# Patient Record
Sex: Male | Born: 1950 | Hispanic: No | Marital: Married | Smoking: Former smoker
Health system: Southern US, Community
[De-identification: ages and names within clinical notes are randomized; demographics above are authoritative.]

## PROBLEM LIST (undated history)

## (undated) DIAGNOSIS — W3400XA Accidental discharge from unspecified firearms or gun, initial encounter: Secondary | ICD-10-CM

## (undated) DIAGNOSIS — I728 Aneurysm of other specified arteries: Secondary | ICD-10-CM

## (undated) HISTORY — PX: CHOLECYSTECTOMY: SHX55

## (undated) HISTORY — DX: Aneurysm of other specified arteries: I72.8

---
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
## 2002-05-16 ENCOUNTER — Encounter: Payer: Self-pay | Admitting: Emergency Medicine

## 2002-05-16 ENCOUNTER — Emergency Department (HOSPITAL_COMMUNITY): Admission: EM | Admit: 2002-05-16 | Discharge: 2002-05-16 | Payer: Self-pay | Admitting: Emergency Medicine

## 2011-06-14 ENCOUNTER — Emergency Department (HOSPITAL_COMMUNITY)
Admission: EM | Admit: 2011-06-14 | Discharge: 2011-06-15 | Disposition: A | Discharge status: Home / Self Care | Payer: BC Managed Care – PPO | Attending: Emergency Medicine | Admitting: Emergency Medicine

## 2011-06-14 DIAGNOSIS — R300 Dysuria: Secondary | ICD-10-CM | POA: Insufficient documentation

## 2011-06-14 DIAGNOSIS — M51379 Other intervertebral disc degeneration, lumbosacral region without mention of lumbar back pain or lower extremity pain: Secondary | ICD-10-CM | POA: Insufficient documentation

## 2011-06-14 DIAGNOSIS — M5137 Other intervertebral disc degeneration, lumbosacral region: Secondary | ICD-10-CM | POA: Insufficient documentation

## 2011-06-14 DIAGNOSIS — M545 Low back pain, unspecified: Secondary | ICD-10-CM | POA: Insufficient documentation

## 2011-06-14 DIAGNOSIS — R110 Nausea: Secondary | ICD-10-CM | POA: Insufficient documentation

## 2011-06-14 DIAGNOSIS — R109 Unspecified abdominal pain: Secondary | ICD-10-CM | POA: Insufficient documentation

## 2011-06-14 LAB — URINALYSIS, ROUTINE W REFLEX MICROSCOPIC
Bilirubin Urine: NEGATIVE
Hgb urine dipstick: NEGATIVE
Ketones, ur: NEGATIVE mg/dL
Nitrite: NEGATIVE
pH: 6.5 (ref 5.0–8.0)

## 2011-06-15 ENCOUNTER — Emergency Department (HOSPITAL_COMMUNITY): Payer: BC Managed Care – PPO

## 2011-06-15 LAB — BASIC METABOLIC PANEL
BUN: 17 mg/dL (ref 6–23)
CO2: 29 mEq/L (ref 19–32)
Chloride: 103 mEq/L (ref 96–112)
Creatinine, Ser: 0.83 mg/dL (ref 0.50–1.35)
GFR calc Af Amer: >90 mL/min (ref >90)
Glucose, Bld: 120 mg/dL — ABNORMAL HIGH (ref 70–99)

## 2011-06-15 LAB — DIFFERENTIAL
Basophils Relative: 0 % (ref 0–1)
Eosinophils Absolute: 0.1 10*3/uL (ref 0.0–0.7)
Lymphocytes Relative: 24 % (ref 12–46)
Monocytes Absolute: 0.4 10*3/uL (ref 0.1–1.0)
Neutro Abs: 4.7 10*3/uL (ref 1.7–7.7)

## 2011-06-15 LAB — CBC
HCT: 34 % — ABNORMAL LOW (ref 39.0–52.0)
MCH: 21.5 pg — ABNORMAL LOW (ref 26.0–34.0)
MCV: 60.5 fL — ABNORMAL LOW (ref 78.0–100.0)
Platelets: 130 10*3/uL — ABNORMAL LOW (ref 150–400)
RBC: 5.62 MIL/uL (ref 4.22–5.81)

## 2014-07-22 ENCOUNTER — Emergency Department (HOSPITAL_COMMUNITY): Payer: Medicaid Other

## 2014-07-22 ENCOUNTER — Emergency Department (HOSPITAL_COMMUNITY)
Admission: EM | Admit: 2014-07-22 | Discharge: 2014-07-22 | Disposition: A | Discharge status: Home / Self Care | Payer: Medicaid Other | Attending: Emergency Medicine | Admitting: Emergency Medicine

## 2014-07-22 ENCOUNTER — Encounter (HOSPITAL_COMMUNITY): Payer: Self-pay | Admitting: Emergency Medicine

## 2014-07-22 DIAGNOSIS — Y93H2 Activity, gardening and landscaping: Secondary | ICD-10-CM | POA: Insufficient documentation

## 2014-07-22 DIAGNOSIS — Z87828 Personal history of other (healed) physical injury and trauma: Secondary | ICD-10-CM | POA: Diagnosis not present

## 2014-07-22 DIAGNOSIS — R339 Retention of urine, unspecified: Secondary | ICD-10-CM | POA: Diagnosis not present

## 2014-07-22 DIAGNOSIS — Y998 Other external cause status: Secondary | ICD-10-CM | POA: Diagnosis not present

## 2014-07-22 DIAGNOSIS — Y9289 Other specified places as the place of occurrence of the external cause: Secondary | ICD-10-CM | POA: Diagnosis not present

## 2014-07-22 DIAGNOSIS — M549 Dorsalgia, unspecified: Secondary | ICD-10-CM

## 2014-07-22 DIAGNOSIS — X58XXXA Exposure to other specified factors, initial encounter: Secondary | ICD-10-CM | POA: Insufficient documentation

## 2014-07-22 DIAGNOSIS — S39092A Other injury of muscle, fascia and tendon of lower back, initial encounter: Secondary | ICD-10-CM | POA: Insufficient documentation

## 2014-07-22 DIAGNOSIS — Z720 Tobacco use: Secondary | ICD-10-CM | POA: Insufficient documentation

## 2014-07-22 HISTORY — DX: Accidental discharge from unspecified firearms or gun, initial encounter: W34.00XA

## 2014-07-22 LAB — COMPREHENSIVE METABOLIC PANEL
ALBUMIN: 3.9 g/dL (ref 3.5–5.2)
ALT: 51 U/L (ref 0–53)
ANION GAP: 15 (ref 5–15)
AST: 24 U/L (ref 0–37)
Alkaline Phosphatase: 91 U/L (ref 39–117)
BUN: 19 mg/dL (ref 6–23)
CALCIUM: 8.9 mg/dL (ref 8.4–10.5)
CO2: 23 meq/L (ref 19–32)
CREATININE: 0.79 mg/dL (ref 0.50–1.35)
Chloride: 101 mEq/L (ref 96–112)
GFR calc Af Amer: >90 mL/min (ref >90)
Glucose, Bld: 100 mg/dL — ABNORMAL HIGH (ref 70–99)
Potassium: 3.8 mEq/L (ref 3.7–5.3)
Sodium: 139 mEq/L (ref 137–147)
Total Bilirubin: 0.7 mg/dL (ref 0.3–1.2)
Total Protein: 7.4 g/dL (ref 6.0–8.3)

## 2014-07-22 LAB — URINALYSIS, ROUTINE W REFLEX MICROSCOPIC
BILIRUBIN URINE: NEGATIVE
Glucose, UA: NEGATIVE mg/dL
HGB URINE DIPSTICK: NEGATIVE
KETONES UR: NEGATIVE mg/dL
Leukocytes, UA: NEGATIVE
Nitrite: NEGATIVE
PROTEIN: NEGATIVE mg/dL
Specific Gravity, Urine: 1.018 (ref 1.005–1.030)
UROBILINOGEN UA: 1 mg/dL (ref 0.0–1.0)
pH: 6 (ref 5.0–8.0)

## 2014-07-22 LAB — CBC WITH DIFFERENTIAL/PLATELET
BASOS ABS: 0 10*3/uL (ref 0.0–0.1)
Basophils Relative: 0 % (ref 0–1)
Eosinophils Absolute: 0.2 10*3/uL (ref 0.0–0.7)
Eosinophils Relative: 4 % (ref 0–5)
HCT: 34.1 % — ABNORMAL LOW (ref 39.0–52.0)
Hemoglobin: 12 g/dL — ABNORMAL LOW (ref 13.0–17.0)
LYMPHS ABS: 1.3 10*3/uL (ref 0.7–4.0)
Lymphocytes Relative: 25 % (ref 12–46)
MCH: 21.5 pg — AB (ref 26.0–34.0)
MCHC: 35.2 g/dL (ref 30.0–36.0)
MCV: 61.1 fL — AB (ref 78.0–100.0)
MONO ABS: 0.4 10*3/uL (ref 0.1–1.0)
Monocytes Relative: 7 % (ref 3–12)
Neutro Abs: 3.3 10*3/uL (ref 1.7–7.7)
Neutrophils Relative %: 64 % (ref 43–77)
PLATELETS: 126 10*3/uL — AB (ref 150–400)
RBC: 5.58 MIL/uL (ref 4.22–5.81)
RDW: 16 % — AB (ref 11.5–15.5)
WBC: 5.2 10*3/uL (ref 4.0–10.5)

## 2014-07-22 MED ORDER — TRAMADOL HCL 50 MG PO TABS: 50.0000 mg | ORAL_TABLET | Freq: Four times a day (QID) | ORAL | Status: DC | PRN

## 2014-07-22 NOTE — ED Notes (Signed)
Patient states he has "bad back pain after mowing yesterday".   Patient states he took ibuprofen yesterday, but no relief.

## 2014-07-22 NOTE — Discharge Instructions (Signed)
-Your back pain is likely due to injury of the muscles in your back and should improve within the next few weeks. -You can continue to take ibuprofen 400 mg every 6 hours as needed for pain. -I also wrote a prescription for tramadol that you can use until your pain improves.  -Your difficulty urinating may be due to an enlarged prostate, and you should establish with a primary care doctor who can prescribe medication that may help. -Please call the [facility] and [place] to schedule an appointment in a couple of weeks.   Back Injury Prevention The following tips can help you to prevent a back injury. PHYSICAL FITNESS  Exercise often. Try to develop strong stomach (abdominal) muscles.  Do aerobic exercises often. This includes walking, jogging, biking, swimming.  Do exercises that help with balance and strength often. This includes tai chi and yoga.  Stretch before and after you exercise.  Keep a healthy weight. DIET   Ask your doctor how much calcium and vitamin D you need every day.  Include calcium in your diet. Foods high in calcium include dairy products; green, leafy vegetables; and products with calcium added (fortified).  Include vitamin D in your diet. Foods high in vitamin D include milk and products with vitamin D added.  Think about taking a multivitamin or other nutritional products called " supplements."  Stop smoking if you smoke. POSTURE   Sit and stand up straight. Avoid leaning forward or hunching over.  Choose chairs that support your lower back.  If you work at a desk:  Sit close to your work so you do not lean over.  Keep your chin tucked in.  Keep your neck drawn back.  Keep your elbows bent at a right angle. Your arms should look like the letter "L."  Sit high and close to the steering wheel when you drive. Add low back support to your car seat if needed.  Avoid sitting or standing in one position for too long. Get up  and move around every hour. Take breaks if you are driving for a long time.  Sleep on your side with your knees slightly bent. You can also sleep on your back with a pillow under your knees. Do not sleep on your stomach. LIFTING, TWISTING, AND REACHING  Avoid heavy lifting, especially lifting over and over again. If you must do heavy lifting:  Stretch before lifting.  Work slowly.  Rest between lifts.  Use carts and dollies to move objects when possible.  Make several small trips instead of carrying 1 heavy load.  Ask for help when you need it.  Ask for help when moving big, awkward objects.  Follow these steps when lifting:  Stand with your feet shoulder-width apart.  Get as close to the object as you can. Do not pick up heavy objects that are far from your body.  Use handles or lifting straps when possible.  Bend at your knees. Squat down, but keep your heels off the floor.  Keep your shoulders back, your chin tucked in, and your back straight.  Lift the object slowly. Tighten the muscles in your legs, stomach, and butt. Keep the object as close to the center of your body as possible.  Reverse these directions when you put a load down.  Do not:  Lift the object above your waist.  Twist at the waist while lifting or carrying a load. Move your feet if you need to turn, not your waist.  Bend over without bending at your knees.  Avoid reaching over your head, across a table, or for an object on a high surface. OTHER TIPS  Avoid wet floors and keep sidewalks clear of ice.  Do not sleep on a mattress that is too soft or too hard.  Keep items that you use often within easy reach.  Put heavier objects on shelves at waist level. Put lighter objects on lower or higher shelves.  Find ways to lessen your stress. You can try exercise, massage, or relaxation.  Get help for depression or anxiety if needed. GET HELP IF:  You injure your back.  You have questions about  diet, exercise, or other ways to prevent back injuries. MAKE SURE YOU:  Understand these instructions.  Will watch your condition.  Will get help right away if you are not doing well or get worse. Document Released: 02/08/2008 Document Revised: 11/14/2011 Document Reviewed: 10/03/2011 [facility] Patient Information 2015 [place], [place]. This information is not intended to replace advice given to you by your health care provider. Make sure you discuss any questions you have with your health care provider.
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
## 2014-07-22 NOTE — ED Notes (Signed)
Intern at bedside. Pt states he began having lower back pain yesterday after mowing the lawn. Pt states he has stabbing abd pain when he bends down. Pt also states he has pain on urination and difficulty with urination.

## 2014-12-28 ENCOUNTER — Encounter (HOSPITAL_COMMUNITY): Payer: Self-pay | Admitting: Emergency Medicine

## 2014-12-28 ENCOUNTER — Emergency Department (HOSPITAL_COMMUNITY)
Admission: EM | Admit: 2014-12-28 | Discharge: 2014-12-28 | Disposition: A | Discharge status: Home / Self Care | Payer: Medicaid Other | Attending: Emergency Medicine | Admitting: Emergency Medicine

## 2014-12-28 DIAGNOSIS — Z720 Tobacco use: Secondary | ICD-10-CM | POA: Insufficient documentation

## 2014-12-28 DIAGNOSIS — R339 Retention of urine, unspecified: Secondary | ICD-10-CM | POA: Insufficient documentation

## 2014-12-28 LAB — URINALYSIS, ROUTINE W REFLEX MICROSCOPIC
BILIRUBIN URINE: NEGATIVE
Glucose, UA: NEGATIVE mg/dL
Hgb urine dipstick: NEGATIVE
KETONES UR: NEGATIVE mg/dL
Leukocytes, UA: NEGATIVE
Nitrite: NEGATIVE
PH: 5.5 (ref 5.0–8.0)
Protein, ur: NEGATIVE mg/dL
Specific Gravity, Urine: 1.007 (ref 1.005–1.030)
Urobilinogen, UA: 0.2 mg/dL (ref 0.0–1.0)

## 2014-12-28 MED ORDER — OXYCODONE-ACETAMINOPHEN 5-325 MG PO TABS: 1.0000 | ORAL_TABLET | ORAL | Status: DC | PRN

## 2014-12-28 MED ORDER — OXYCODONE-ACETAMINOPHEN 5-325 MG PO TABS
1.0000 | ORAL_TABLET | Freq: Once | ORAL | Status: AC
  Administered 2014-12-28: 1 via ORAL
  Filled 2014-12-28: qty 1

## 2014-12-28 NOTE — ED Notes (Signed)
Educated pt on how to care for indwelling catheter.  Instructed pt on how to change from leg bag to larger bag.  Also gave pt incontinence spray and emphasized importance of keeping catheter tube clean to prevent infection.  Pt verbalized understanding.
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
## 2014-12-28 NOTE — ED Notes (Signed)
Pt states that he has had urinary retention x 1 day. Drank several beers today. Alert and oriented.

## 2014-12-28 NOTE — Discharge Instructions (Signed)
Acute Urinary Retention °Acute urinary retention is the temporary inability to urinate. °This is a common problem in older men. As men age their prostates become larger and block the flow of urine from the bladder. This is usually a problem that has come on gradually.  °HOME CARE INSTRUCTIONS °If you are sent home with a Foley catheter and a drainage system, you will need to discuss the best course of action with your health care provider. While the catheter is in, maintain a good intake of fluids. Keep the drainage bag emptied and lower than your catheter. This is so that contaminated urine will not flow back into your bladder, which could lead to a urinary tract infection. °There are two main types of drainage bags. One is a large bag that usually is used at night. It has a good capacity that will allow you to sleep through the night without having to empty it. The second type is called a leg bag. It has a smaller capacity, so it needs to be emptied more frequently. However, the main advantage is that it can be attached by a leg strap and can go underneath your clothing, allowing you the freedom to move about or leave your home. °Only take over-the-counter or prescription medicines for pain, discomfort, or fever as directed by your health care provider.  °SEEK MEDICAL CARE IF: °· You develop a low-grade fever. °· You experience spasms or leakage of urine with the spasms. °SEEK IMMEDIATE MEDICAL CARE IF:  °1. You develop chills or fever. °2. Your catheter stops draining urine. °3. Your catheter falls out. °4. You start to develop increased bleeding that does not respond to rest and increased fluid intake. °MAKE SURE YOU: °1. Understand these instructions. °2. Will watch your condition. °3. Will get help right away if you are not doing well or get worse. °Document Released: 11/28/2000 Document Revised: 08/27/2013 Document Reviewed: 01/31/2013 °ExitCare® Patient Information ©2015 [employer]. This information is  not intended to replace advice given to you by your health care provider. Make sure you discuss any questions you have with your health care provider. °Foley Catheter Care °A Foley catheter is a soft, flexible tube that is placed into the bladder to drain urine. A Foley catheter may be inserted if: °· You leak urine or are not able to control when you urinate (urinary incontinence). °· You are not able to urinate when you need to (urinary retention). °· You had prostate surgery or surgery on the genitals. °· You have certain medical conditions, such as multiple sclerosis, dementia, or a spinal cord injury. °If you are going home with a Foley catheter in place, follow the instructions below. °TAKING CARE OF THE CATHETER °5. Wash your hands with soap and water. °6. Using mild soap and warm water on a clean washcloth: °· Clean the area on your body closest to the catheter insertion site using a circular motion, moving away from the catheter. Never wipe toward the catheter because this could sweep bacteria up into the urethra and cause infection. °· Remove all traces of soap. Pat the area dry with a clean towel. For males, reposition the foreskin. °7. Attach the catheter to your leg so there is no tension on the catheter. Use adhesive tape or a leg strap. If you are using adhesive tape, remove any sticky residue left behind by the previous tape you used. °8. Keep the drainage bag below the level of the bladder, but keep it off the floor. °9. Check throughout   the day to be sure the catheter is working and urine is draining freely. Make sure the tubing does not become kinked. °10. Do not pull on the catheter or try to remove it. Pulling could damage internal tissues. °TAKING CARE OF THE DRAINAGE BAGS °You will be given two drainage bags to take home. One is a large overnight drainage bag, and the other is a smaller leg bag that fits underneath clothing. You may wear the overnight bag at any time, but you should never wear  the smaller leg bag at night. Follow the instructions below for how to empty, change, and clean your drainage bags. °Emptying the Drainage Bag °You must empty your drainage bag when it is  -½ full or at least 2-3 times a day. °4. Wash your hands with soap and water. °5. Keep the drainage bag below your hips, below the level of your bladder. This stops urine from going back into the tubing and into your bladder. °6. Hold the dirty bag over the toilet or a clean container. °7. Open the pour spout at the bottom of the bag and empty the urine into the toilet or container. Do not let the pour spout touch the toilet, container, or any other surface. Doing so can place bacteria on the bag, which can cause an infection. °8. Clean the pour spout with a gauze pad or cotton ball that has rubbing alcohol on it. °9. Close the pour spout. °10. Attach the bag to your leg with adhesive tape or a leg strap. °11. Wash your hands well. °Changing the Drainage Bag °Change your drainage bag once a month or sooner if it starts to smell bad or look dirty. Below are steps to follow when changing the drainage bag. °1. Wash your hands with soap and water. °2. Pinch off the rubber catheter so that urine does not spill out. °3. Disconnect the catheter tube from the drainage tube at the connection valve. Do not let the tubes touch any surface. °4. Clean the end of the catheter tube with an alcohol wipe. Use a different alcohol wipe to clean the end of the drainage tube. °5. Connect the catheter tube to the drainage tube of the clean drainage bag. °6. Attach the new bag to the leg with adhesive tape or a leg strap. Avoid attaching the new bag too tightly. °7. Wash your hands well. °Cleaning the Drainage Bag °1. Wash your hands with soap and water. °2. Wash the bag in warm, soapy water. °3. Rinse the bag thoroughly with warm water. °4. Fill the bag with a solution of white vinegar and water (1 cup vinegar to 1 qt warm water [.2 L vinegar to 1 L  warm water]). Close the bag and soak it for 30 minutes in the solution. °5. Rinse the bag with warm water. °6. Hang the bag to dry with the pour spout open and hanging downward. °7. Store the clean bag (once it is dry) in a clean plastic bag. °8. Wash your hands well. °PREVENTING INFECTION °· Wash your hands before and after handling your catheter. °· Take showers daily and wash the area where the catheter enters your body. Do not take baths. Replace wet leg straps with dry ones, if this applies. °· Do not use powders, sprays, or lotions on the genital area. Only use creams, lotions, or ointments as directed by your caregiver. °· For females, wipe from front to back after each bowel movement. °· Drink enough fluids to keep your urine   clear or pale yellow unless you have a fluid restriction. °· Do not let the drainage bag or tubing touch or lie on the floor. °· Wear cotton underwear to absorb moisture and to keep your skin drier. °SEEK MEDICAL CARE IF:  °· Your urine is cloudy or smells unusually bad. °· Your catheter becomes clogged. °· You are not draining urine into the bag or your bladder feels full. °· Your catheter starts to leak. °SEEK IMMEDIATE MEDICAL CARE IF:  °· You have pain, swelling, redness, or pus where the catheter enters the body. °· You have pain in the abdomen, legs, lower back, or bladder. °· You have a fever. °· You see blood fill the catheter, or your urine is pink or red. °· You have nausea, vomiting, or chills. °· Your catheter gets pulled out. °MAKE SURE YOU:  °· Understand these instructions. °· Will watch your condition. °· Will get help right away if you are not doing well or get worse. °Document Released: 08/22/2005 Document Revised: 01/06/2014 Document Reviewed: 08/13/2012 °ExitCare® Patient Information ©2015 [employer]. This information is not intended to replace advice given to you by your health care provider. Make sure you discuss any questions you have with your health care  provider. ° °

## 2014-12-31 ENCOUNTER — Telehealth: Payer: Self-pay | Admitting: *Deleted

## 2014-12-31 NOTE — Telephone Encounter (Signed)
[facility] called to request NPI number for patient.  NPI given x 1 visit, pt needs to contact medicaid office to change provider information on card. Derl Barrow, RN

## 2015-01-02 ENCOUNTER — Encounter: Payer: Self-pay | Admitting: Family Medicine

## 2015-01-02 ENCOUNTER — Ambulatory Visit: Payer: Medicaid Other | Admitting: Family Medicine

## 2015-01-02 ENCOUNTER — Ambulatory Visit (INDEPENDENT_AMBULATORY_CARE_PROVIDER_SITE_OTHER): Payer: 59 | Admitting: Family Medicine

## 2015-01-02 VITALS — BP 130/80 | HR 82 | Temp 97.8°F | Wt 173.1 lb

## 2015-01-02 DIAGNOSIS — M159 Polyosteoarthritis, unspecified: Secondary | ICD-10-CM

## 2015-01-02 DIAGNOSIS — M150 Primary generalized (osteo)arthritis: Secondary | ICD-10-CM

## 2015-01-02 DIAGNOSIS — R339 Retention of urine, unspecified: Secondary | ICD-10-CM

## 2015-01-02 DIAGNOSIS — M1990 Unspecified osteoarthritis, unspecified site: Secondary | ICD-10-CM | POA: Insufficient documentation

## 2015-01-02 DIAGNOSIS — M8949 Other hypertrophic osteoarthropathy, multiple sites: Secondary | ICD-10-CM

## 2015-01-02 MED ORDER — NAPROXEN 500 MG PO TABS: 500.0000 mg | ORAL_TABLET | Freq: Two times a day (BID) | ORAL | Status: DC

## 2015-01-02 NOTE — Patient Instructions (Addendum)
I am very happy to have you as my patient now. If you are having any issues you can call our office and you can be seen here instead of the emergency room for most things.  I will get records from Dr. Risa Grill and determine if there is anything else that needs to be done.  For your back pain, I am prescribing naproxen, which should decrease the inflammation and pain.  When you see Dr. Risa Grill again, please ask him to send me his notes so that I will know what he is doing to help you.  Thank you.

## 2015-01-02 NOTE — Assessment & Plan Note (Addendum)
Seen in ED 5 days ago with pain in back and abdomen and unable to pee. Seen by urology yesterday and started on cipro and flomax (unsure diagnosis wityhout notes but maybe prostatitis?). Still has foley in place and reports minimal output. Reporting ongoing back and abdominal pain that started at the same time as the urinary retention and requesting an xray to determine where pain is coming from - will get records release for urology, also gave him my card to show Grapey next week so her can continue sending me his notes - no imaging indicated at this time - f/u urology recs - if pain not improving with urology management will investigate other etiologies

## 2015-01-03 ENCOUNTER — Encounter: Payer: Self-pay | Admitting: Family Medicine

## 2015-01-03 NOTE — Assessment & Plan Note (Addendum)
Degenerative changes on lumbar xray 6 months ago, current complaint of diffuse back pain - trial of naproxen - f/u in 1 month, sooner if not improving

## 2015-01-03 NOTE — Progress Notes (Signed)
   Subjective:    Patient ID: Bobby [name], male    DOB: December 11, 1950, 64 y.o.   MRN: [id number]  HPI Pt presents to establish care and for f/u of urinary retention and back pain. Went to ED last week because he couldn't pee and had a foley placed and uro f/u arranged. Saw Grapey with [facility] yesterday and was placed on flomax and cipro and the foley was left in place. He does not know why he's taking these medicines but has 2 additional follow-up visits with Grapey scheduled in the next 2 weeks. He did have a mildly enlarged prostate gland on abdominal CT in 2012 but no history of prostate or urinary problems per patient. He also complains of back pain which he says started when the urinary retention did. Per his records he has certainly complained of back pain in the past but he denies this now, lumbar xray last year showed degerative changes.  Other than these acute issues he has no significant medical history. He had his appendix out in 1993. History updated in chart. No FH known. Smokes and drinks alcohol occasionally   Review of Systems See HPI    Objective:   Physical Exam  Constitutional: He is oriented to person, place, and time. He appears well-developed and well-nourished. No distress.  HENT:  Head: Normocephalic and atraumatic.  Eyes: Conjunctivae are normal.  Cardiovascular: Normal rate, regular rhythm and normal heart sounds.   No murmur heard. Pulmonary/Chest: Effort normal and breath sounds normal. No respiratory distress. He has no wheezes.  Abdominal: Soft. Bowel sounds are normal. He exhibits no distension and no mass. There is no rebound and no guarding.  Mild lower abdominal tenderness  Musculoskeletal:       Cervical back: He exhibits pain. He exhibits no tenderness, no bony tenderness, no swelling and no laceration.       Thoracic back: He exhibits pain. He exhibits no tenderness, no bony tenderness, no swelling and no laceration.       Lumbar back: He exhibits pain.  He exhibits no tenderness, no bony tenderness, no swelling, no edema, no deformity, no laceration and no spasm.  Diffuse back pain without tenderness or localization, ROM limited by pain  Neurological: He is alert and oriented to person, place, and time.  Skin: Skin is warm and dry. No rash noted. He is not diaphoretic.  Psychiatric: He has a normal mood and affect. His behavior is normal.  Nursing note and vitals reviewed.         Assessment & Plan:

## 2015-01-30 ENCOUNTER — Ambulatory Visit (INDEPENDENT_AMBULATORY_CARE_PROVIDER_SITE_OTHER): Payer: 59 | Admitting: Family Medicine

## 2015-01-30 VITALS — BP 118/66 | HR 70 | Temp 98.3°F | Ht 68.0 in | Wt 180.4 lb

## 2015-01-30 DIAGNOSIS — N410 Acute prostatitis: Secondary | ICD-10-CM | POA: Diagnosis not present

## 2015-01-30 DIAGNOSIS — M25511 Pain in right shoulder: Secondary | ICD-10-CM

## 2015-01-30 DIAGNOSIS — R300 Dysuria: Secondary | ICD-10-CM | POA: Diagnosis not present

## 2015-01-30 DIAGNOSIS — M25519 Pain in unspecified shoulder: Secondary | ICD-10-CM | POA: Insufficient documentation

## 2015-01-30 LAB — POCT URINALYSIS DIPSTICK
Bilirubin, UA: NEGATIVE
Blood, UA: NEGATIVE
Glucose, UA: NEGATIVE
KETONES UA: NEGATIVE
LEUKOCYTES UA: NEGATIVE
Nitrite, UA: NEGATIVE
PH UA: 6.5
PROTEIN UA: 30
SPEC GRAV UA: 1.025
Urobilinogen, UA: 0.2

## 2015-01-30 LAB — POCT UA - MICROSCOPIC ONLY

## 2015-01-30 MED ORDER — BACLOFEN 10 MG PO TABS: 10.0000 mg | ORAL_TABLET | Freq: Every evening | ORAL | Status: DC | PRN

## 2015-01-30 MED ORDER — CIPROFLOXACIN HCL 500 MG PO TABS: 500.0000 mg | ORAL_TABLET | Freq: Two times a day (BID) | ORAL | Status: DC

## 2015-01-30 NOTE — Patient Instructions (Signed)
For your bladder pain I would like to continue your antibiotic for 2 more weeks.   For your arm and shoulder pain, you can use the naproxen that I gave you as needed. I am also prescribing baclofen, a musle relaxer, to use for the pain at night.

## 2015-02-05 NOTE — Assessment & Plan Note (Signed)
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

## 2015-02-05 NOTE — Progress Notes (Signed)
Subjective:     Patient ID: Bobby [name], male   DOB: Aug 18, 1951, 64 y.o.   MRN: [id number]  HPI Pt presents for follow-up of pelvic and back pain and urinary retention. Pt reports he is feeling better overall, passing urine easily, but is still having some burning and pain with urination. He completed his 4 week course of cipro yesterday (I do not have urology notes but it sounds like this was for prostatitis).   He also complains of some shoulder pain R>L that is present in the evenings after work and sometimes makes it difficult for him to get comfortable in bed and fall asleep. He is taking naproxen for this which does help. The pain is mostly in the shoulders but extends from the neck to his elbows at times. No weakness or numbness.  Review of Systems No fever, chills See HPI    Objective:   Physical Exam  Constitutional: He is oriented to person, place, and time. He appears well-developed and well-nourished. No distress.  HENT:  Head: Normocephalic and atraumatic.  Eyes: Conjunctivae are normal. Right eye exhibits no discharge. Left eye exhibits no discharge.  Neck: Normal range of motion. Neck supple.  Cardiovascular: Normal rate, regular rhythm and normal heart sounds.   No murmur heard. Pulmonary/Chest: Effort normal and breath sounds normal. No respiratory distress. He has no wheezes.  Abdominal: Soft. He exhibits no distension.  Mild suprapubic tenderness  Musculoskeletal: Normal range of motion.       Right shoulder: He exhibits tenderness, pain and spasm. He exhibits normal range of motion, no bony tenderness, no swelling, no effusion, no crepitus, no deformity, no laceration and normal strength.       Left shoulder: He exhibits tenderness, pain and spasm. He exhibits normal range of motion, no bony tenderness, no swelling, no effusion, no crepitus, no deformity, no laceration, normal pulse and normal strength.  Neurological: He is alert and oriented to person, place, and time.  Skin:  Skin is warm and dry. No rash noted. He is not diaphoretic.  Psychiatric: He has a normal mood and affect. His behavior is normal.  Nursing note and vitals reviewed.      Assessment:     See problem based charting    Plan:

## 2015-02-05 NOTE — Assessment & Plan Note (Signed)
Still having pelvic pain and pressure, worse with urination, finished 4 weeks cipro a few days ago - will extend for full 6 week course given patient still symptomatic - continue flomax per urology - f/u with urology as directed, patient has been frustrated by lack of communication so I encouraged him to insist on an interpreter and ask questions during his visits - overall pain is better and he is passing urine without difficulty at this point - f/u with me in 1 month

## 2015-03-04 ENCOUNTER — Ambulatory Visit: Payer: Medicaid Other | Admitting: Family Medicine

## 2015-04-02 ENCOUNTER — Ambulatory Visit (INDEPENDENT_AMBULATORY_CARE_PROVIDER_SITE_OTHER): Payer: 59 | Admitting: Family Medicine

## 2015-04-02 ENCOUNTER — Ambulatory Visit (INDEPENDENT_AMBULATORY_CARE_PROVIDER_SITE_OTHER): Payer: 59

## 2015-04-02 VITALS — BP 128/70 | HR 66 | Temp 97.6°F | Resp 16 | Ht 68.75 in | Wt 180.0 lb

## 2015-04-02 DIAGNOSIS — R109 Unspecified abdominal pain: Secondary | ICD-10-CM | POA: Diagnosis not present

## 2015-04-02 DIAGNOSIS — R1013 Epigastric pain: Secondary | ICD-10-CM

## 2015-04-02 LAB — POCT URINALYSIS DIPSTICK
Bilirubin, UA: NEGATIVE
Glucose, UA: NEGATIVE
Ketones, UA: NEGATIVE
LEUKOCYTES UA: NEGATIVE
Nitrite, UA: NEGATIVE
PH UA: 7
PROTEIN UA: NEGATIVE
RBC UA: NEGATIVE
SPEC GRAV UA: 1.015
UROBILINOGEN UA: 1

## 2015-04-02 LAB — LIPASE: LIPASE: 30 U/L (ref 0–75)

## 2015-04-02 LAB — POCT UA - MICROSCOPIC ONLY
Bacteria, U Microscopic: NEGATIVE
Casts, Ur, LPF, POC: NEGATIVE
Crystals, Ur, HPF, POC: NEGATIVE
Epithelial cells, urine per micros: NEGATIVE
MUCUS UA: NEGATIVE
RBC, URINE, MICROSCOPIC: NEGATIVE
WBC, Ur, HPF, POC: NEGATIVE
Yeast, UA: NEGATIVE

## 2015-04-02 LAB — POCT CBC
Granulocyte percent: 66.1 %G (ref 37–80)
HCT, POC: 40.4 % — AB (ref 43.5–53.7)
HEMOGLOBIN: 12.8 g/dL — AB (ref 14.1–18.1)
LYMPH, POC: 1.3 (ref 0.6–3.4)
MCH: 20.7 pg — AB (ref 27–31.2)
MCHC: 31.8 g/dL (ref 31.8–35.4)
MCV: 65.1 fL — AB (ref 80–97)
MID (CBC): 0.4 (ref 0–0.9)
MPV: 9.7 fL (ref 0–99.8)
POC GRANULOCYTE: 3.4 (ref 2–6.9)
POC LYMPH PERCENT: 25.3 %L (ref 10–50)
POC MID %: 8.6 %M (ref 0–12)
Platelet Count, POC: 133 10*3/uL — AB (ref 142–424)
RBC: 6.2 M/uL — AB (ref 4.69–6.13)
RDW, POC: 16.3 %
WBC: 5.2 10*3/uL (ref 4.6–10.2)

## 2015-04-02 LAB — COMPREHENSIVE METABOLIC PANEL
ALBUMIN: 4.2 g/dL (ref 3.6–5.1)
ALT: 32 U/L (ref 9–46)
AST: 21 U/L (ref 10–35)
Alkaline Phosphatase: 68 U/L (ref 40–115)
BILIRUBIN TOTAL: 0.9 mg/dL (ref 0.2–1.2)
BUN: 26 mg/dL — AB (ref 7–25)
CALCIUM: 8.9 mg/dL (ref 8.6–10.3)
CHLORIDE: 103 meq/L (ref 98–110)
CO2: 28 mEq/L (ref 20–31)
Creat: 0.97 mg/dL (ref 0.70–1.25)
Glucose, Bld: 88 mg/dL (ref 65–99)
Potassium: 4.4 mEq/L (ref 3.5–5.3)
Sodium: 139 mEq/L (ref 135–146)
Total Protein: 7.4 g/dL (ref 6.1–8.1)

## 2015-04-02 LAB — AMYLASE: AMYLASE: 56 U/L (ref 0–105)

## 2015-04-02 MED ORDER — OMEPRAZOLE 20 MG PO CPDR: 20.0000 mg | DELAYED_RELEASE_CAPSULE | Freq: Every day | ORAL | Status: DC

## 2015-04-02 MED ORDER — SUCRALFATE 1 G PO TABS: 1.0000 g | ORAL_TABLET | Freq: Three times a day (TID) | ORAL | Status: DC

## 2015-04-02 NOTE — Patient Instructions (Signed)
I will give you a call tomorrow with your labs- if we do not see any reason for your pain we may do a CT of your belly Take the carafate before meals and before bed, and the prilosec once a day
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
## 2015-04-03 ENCOUNTER — Telehealth: Payer: Self-pay | Admitting: Family Medicine

## 2015-04-03 ENCOUNTER — Ambulatory Visit
Admission: RE | Admit: 2015-04-03 | Discharge: 2015-04-03 | Disposition: A | Discharge status: Home / Self Care | Payer: 59 | Source: Ambulatory Visit | Attending: Family Medicine | Admitting: Family Medicine

## 2015-04-03 DIAGNOSIS — R109 Unspecified abdominal pain: Secondary | ICD-10-CM

## 2015-04-03 DIAGNOSIS — R1013 Epigastric pain: Secondary | ICD-10-CM

## 2015-04-03 LAB — URINE CULTURE
COLONY COUNT: NO GROWTH
Organism ID, Bacteria: NO GROWTH

## 2015-04-03 MED ORDER — IOHEXOL 300 MG/ML  SOLN
30.0000 mL | Freq: Once | INTRAMUSCULAR | Status: AC | PRN
  Administered 2015-04-03: 30 mL via ORAL

## 2015-04-03 MED ORDER — IOPAMIDOL (ISOVUE-300) INJECTION 61%
100.0000 mL | Freq: Once | INTRAVENOUS | Status: AC | PRN
  Administered 2015-04-03: 100 mL via INTRAVENOUS

## 2015-04-03 NOTE — Telephone Encounter (Signed)
Reached him, he is ok with doing CT, arranged this for him at 1:30 today.  He is aware of appt

## 2015-04-04 ENCOUNTER — Telehealth: Payer: Self-pay | Admitting: Family Medicine

## 2015-04-04 NOTE — Telephone Encounter (Signed)
Called him to go over his CT scan.  It is normal, and his labs are also normal.  Advised that I am not sure why he is having this pain.  Offered to have him come back to clinic for a recheck, to go to the ER, or to schedule with GI.  For the time being he prefers to see how he feels over the next couple of days, but he will seek care if getting worse

## 2015-04-08 ENCOUNTER — Encounter: Payer: Self-pay | Admitting: Family Medicine

## 2015-05-25 ENCOUNTER — Telehealth: Payer: Self-pay

## 2015-05-25 NOTE — Telephone Encounter (Signed)
Patient came to the walk in to request that he get a referral to [facility] [address] Phone: [phone] Patient phone: [phone] Patients friend, Shade Flood, request that we call him. He's the translator. [phone]

## 2015-05-25 NOTE — Telephone Encounter (Signed)
Pt would like referral to [facility]. His Insurance is [employer]

## 2015-05-27 NOTE — Telephone Encounter (Signed)
A diagnosis code from the provider is needed before the insurance referral can be completed. Please advise, thank you.

## 2015-05-27 NOTE — Telephone Encounter (Signed)
Dysuria - Primary     ICD-9-CM: 788.1 ICD-10-CM: R30.0    Prostatitis, acute     ICD-9-CM: 601.0 ICD-10-CM: N41.0

## 2015-05-29 NOTE — Telephone Encounter (Signed)
Patient is calling about a referral. I was confused about what he was saying. Please call for more details. [phone]

## 2015-08-17 ENCOUNTER — Ambulatory Visit (INDEPENDENT_AMBULATORY_CARE_PROVIDER_SITE_OTHER): Payer: 59 | Admitting: Emergency Medicine

## 2015-08-17 VITALS — BP 130/74 | HR 79 | Temp 97.7°F | Resp 16 | Ht 68.0 in | Wt 179.8 lb

## 2015-08-17 DIAGNOSIS — M150 Primary generalized (osteo)arthritis: Secondary | ICD-10-CM

## 2015-08-17 DIAGNOSIS — M159 Polyosteoarthritis, unspecified: Secondary | ICD-10-CM

## 2015-08-17 DIAGNOSIS — M8949 Other hypertrophic osteoarthropathy, multiple sites: Secondary | ICD-10-CM

## 2015-08-17 MED ORDER — CYCLOBENZAPRINE HCL 10 MG PO TABS: ORAL_TABLET | ORAL | Status: DC

## 2015-08-17 MED ORDER — NAPROXEN 500 MG PO TABS: 500.0000 mg | ORAL_TABLET | Freq: Two times a day (BID) | ORAL | Status: DC

## 2015-08-17 NOTE — Patient Instructions (Signed)
?au Th?n Kinh T?a (Sciatica) ?au th?n kinh t?a l ?au, y?u, t ho?c ?au bu?t d?c theo ???ng ?i c?a dy th?n kinh hng. Dy th?n kinh b?t ??u ? th?t l?ng v ch?y xu?ng pha m?t sau m?i chn. Dy th?n kinh ny ?i?u khi?n cc c? ? c?ng chn v ? m?t sau c?a ??u g?i, trong khi c?ng cho c?m gic ? m?t sau c?a ?i, c?ng chn v lng bn chn. ?au th?n kinh t?a l tri?u ch?ng c?a m?t ch?ng b?nh khc. V d?, t?n th??ng dy th?n kinh ho?c m?t s? tnh tr?ng c? th?, ch?ng h?n nh? thot v? ??a ??m ho?c m? x??ng trn c?t s?ng, k?p ho?c t ? ln dy th?n kinh hng. ?i?u ny gy ?au, y?u ho?c nh?ng c?m gic khc, th??ng k?t h?p v?i ?au th?n kinh t?a. Ni chung, ?au th?n kinh t?a ch? ?nh h??ng ??n m?t bn c?a c? th?.  NGUYN NHN  Thot v? ho?c tr??t ??a ??m.  B?nh thoi ha ??a ??m.  R?i lo?n ?au lin quan ??n c? h?p ? mng (h?i ch?ng piriformis).  Ch?n th??ng ho?c gy x??ng ch?u.  Mang thai.  Kh?i u (hi?m). TRI?U CH?NG Cc tri?u ch?ng c th? khc nhau t? nh? ??n r?t n?ng. Cc tri?u ch?ng th??ng ?i t? th?t l?ng xu?ng mng v xu?ng m?t sau c?a chn. Cc tri?u ch?ng c th? bao g?m:  ?au bu?t nh? ho?c ?au m ? ? th?t l?ng, chn ho?c hng.  T ? m?t sau c?a b?p chn ho?c gan bn chn.  C?m gic nng rt ? th?t l?ng, chn ho?c hng.  ?au nhi ? th?t l?ng, chn ho?c hng.  Y?u chn.  ?au l?ng nghim tr?ng gy kh kh?n cho vi?c ?i l?i. Cc tri?u ch?ng ny c th? t?i t? h?n khi c ho, h?t h?i, c??i ho?c ng?i ho?c ??ng lu. Ngoi ra, bo ph c th? lm tr?m tr?ng thm cc tri?u ch?ng. CH?N ?ON Chuyn gia ch?m sc s?c kh?e s? khm th?c th? ?? tm cc tri?u ch?ng ph? bi?n c?a ?au th?n kinh t?a. Chuyn gia ch?m sc s?c kh?e c th? yu c?u b?n th?c hi?n nh?ng ??ng tc ho?c ho?t ??ng nh?t ??nh gy ?au dy th?n kinh hng. Cc ki?m tra khc c th? ???c th?c hi?n ?? tm ra nguyn nhn ?au th?n kinh t?a. Cc bi t?p ny c th? bao g?m:  Xt nghi?m mu.  Ch?p X quang.  Ki?m tra hnh ?nh, ch?ng h?n nh? MRI ho?c  ch?p CT. ?I?U TR? ?i?u tr? nh?m vo nguyn nhn gy ?au th?n kinh t?a. ?i khi, khng c?n ?i?u tr?, c?n ?au v c?m gic kh ch?u s? t? h?t. N?u c?n ?i?u tr?, chuyn gia ch?m sc s?c kh?e c th? ?? ngh?:  Thu?c gi?m ?au khng c?n k toa.  Thu?c k toa, ch?ng h?n nh? thu?c khng vim, gin c? ho?c thu?c gi?m ?au gy ng?.  Ch??m nng ho?c ? l?nh vo ch? ?au.  Tim steroid ?? gi?m ?au, gi?m kch thch v gi?m vim quanh dy th?n kinh.  Gi?m ho?t ??ng trong th?i gian ?au.  T?p th? d?c v ko gin ?? t?ng s?c b?n ? b?ng v c?i thi?n ?? m?m d?o c?a c?t s?ng. Chuyn gia ch?m sc s?c kh?e c th? ?? ngh? gi?m cn n?u th?a cn khi?n ?au l?ng t?i t? h?n.  V?t l tr? li?u.  Ph?u thu?t ?? lo?i b? nh?ng g ?ang gy s?c p ho?c k?p dy th?n kinh, ch?ng h?n nh?   m? x??ng ho?c m?t ph?n c?a thot v? ??a ??m. H??NG D?N CH?M [place] T?I NH  Ch? s? d?ng thu?c khng c?n k toa ho?c thu?c c?n k toa ?? gi?m ?au ho?c gi?m c?m gic kh ch?u theo ch? d?n c?a chuyn gia ch?m [place] s?c kh?e c?a b?n.  Ch??m ? vo khu v?c b? ?nh h??ng trong 20 pht, 3-4 l?n m?i ngy trong 48-72 gi? ??u tin. Sau ? th? ch??m nng theo cng cch.  T?p th? d?c, ko gin ho?c th?c hi?n cc ho?t ??ng thng th??ng c?a b?n n?u nh?ng ho?t ??ng ny khng lm tr?m tr?ng thm c?n ?au c?a b?n.  Tham d? cc bu?i v?t l tr? li?u theo ch? d?n c?a chuyn gia ch?m [place] s?c kh?e.  Tun th? m?i cu?c h?n khm l?i theo ch? d?n c?a chuyn gia ch?m [place] s?c kh?e.  Khng mang giy cao gt ho?c giy khng h? tr? thch h?p.  Ki?m tra n?m c?a b?n xem n c qu m?m khng. N?m c?ng ch?c c th? gi?m ?au v gi?m c?m gic kh ch?u c?a b?n. HY NGAY L?P T?C ?I KHM N?U:  B?n ?i ngoi ho?c ?i ti?u m?t ki?m sot (khng t? ch?).  B?n b? y?u gia t?ng ? th?t l?ng, khung ch?u, mng v chn.  B?n c b? t?y ?? ho?c s?ng ? l?ng.  B?n c c?m gic nng rt khi ?i ti?u.  B?n b? ?au n?ng h?n khi n?m xu?ng ho?c khi t?nh gi?c vo ban ?m.  C?n ?au t?i t? h?n so v?i c?  ?au b?n b? trong qu kh?.  C?n ?au ko di h?n 4 tu?n.  B?n ??t nhin gi?m cn m khng c l do. ??M B?O B?N:  Hi?u cc h??ng d?n ny.  S? theo di tnh tr?ng c?a mnh.  S? yu c?u tr? gip ngay l?p t?c n?u b?n c?m th?y khng ?? ho?c tnh tr?ng tr?m tr?ng h?n.   Thng tin ny khng nh?m m?c ?ch thay th? cho l?i khuyn m chuyn gia ch?m [place] s?c kh?e ni v?i qu v?. Hy b?o ??m qu v? ph?i th?o lu?n b?t k? v?n ?? g m qu v? c v?i chuyn gia ch?m [place] s?c kh?e c?a qu v?.   Document Released: 02/21/2012 Document Revised: 05/13/2015 Elsevier Interactive Patient Education [employer].
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
## 2015-11-03 ENCOUNTER — Ambulatory Visit (INDEPENDENT_AMBULATORY_CARE_PROVIDER_SITE_OTHER): Payer: 59 | Admitting: Urgent Care

## 2015-11-03 VITALS — BP 100/62 | HR 92 | Temp 98.6°F | Resp 18 | Ht 68.5 in | Wt 177.6 lb

## 2015-11-03 DIAGNOSIS — R05 Cough: Secondary | ICD-10-CM

## 2015-11-03 DIAGNOSIS — J029 Acute pharyngitis, unspecified: Secondary | ICD-10-CM | POA: Diagnosis not present

## 2015-11-03 DIAGNOSIS — J101 Influenza due to other identified influenza virus with other respiratory manifestations: Secondary | ICD-10-CM | POA: Diagnosis not present

## 2015-11-03 DIAGNOSIS — R6883 Chills (without fever): Secondary | ICD-10-CM

## 2015-11-03 DIAGNOSIS — R51 Headache: Secondary | ICD-10-CM

## 2015-11-03 DIAGNOSIS — R059 Cough, unspecified: Secondary | ICD-10-CM

## 2015-11-03 DIAGNOSIS — F17200 Nicotine dependence, unspecified, uncomplicated: Secondary | ICD-10-CM

## 2015-11-03 DIAGNOSIS — R519 Headache, unspecified: Secondary | ICD-10-CM

## 2015-11-03 LAB — POCT INFLUENZA A/B
INFLUENZA B, POC: POSITIVE — AB
Influenza A, POC: NEGATIVE

## 2015-11-03 MED ORDER — BENZONATATE 100 MG PO CAPS: 100.0000 mg | ORAL_CAPSULE | Freq: Three times a day (TID) | ORAL | Status: DC | PRN

## 2015-11-03 MED ORDER — HYDROCODONE-HOMATROPINE 5-1.5 MG/5ML PO SYRP: 5.0000 mL | ORAL_SOLUTION | Freq: Every evening | ORAL | Status: DC | PRN
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
## 2015-11-03 NOTE — Patient Instructions (Addendum)
Influenza, Adult Influenza ("the flu") is a viral infection of the respiratory tract. It occurs more often in winter months because people spend more time in close contact with one another. Influenza can make you feel very sick. Influenza easily spreads from person to person (contagious). CAUSES  Influenza is caused by a virus that infects the respiratory tract. You can catch the virus by breathing in droplets from an infected person's cough or sneeze. You can also catch the virus by touching something that was recently contaminated with the virus and then touching your mouth, nose, or eyes. RISKS AND COMPLICATIONS You may be at risk for a more severe case of influenza if you smoke cigarettes, have diabetes, have chronic heart disease (such as heart failure) or lung disease (such as asthma), or if you have a weakened immune system. Elderly people and pregnant women are also at risk for more serious infections. The most common problem of influenza is a lung infection (pneumonia). Sometimes, this problem can require emergency medical care and may be life threatening. SIGNS AND SYMPTOMS  Symptoms typically last 4 to 10 days and may include:  Fever.  Chills.  Headache, body aches, and muscle aches.  Sore throat.  Chest discomfort and cough.  Poor appetite.  Weakness or feeling tired.  Dizziness.  Nausea or vomiting. DIAGNOSIS  Diagnosis of influenza is often made based on your history and a physical exam. A nose or throat swab test can be done to confirm the diagnosis. TREATMENT  In mild cases, influenza goes away on its own. Treatment is directed at relieving symptoms. For more severe cases, your health care provider may prescribe antiviral medicines to shorten the sickness. Antibiotic medicines are not effective because the infection is caused by a virus, not by bacteria. HOME CARE INSTRUCTIONS  Take medicines only as directed by your health care provider.  Use a cool mist humidifier  to make breathing easier.  Get plenty of rest until your temperature returns to normal. This usually takes 3 to 4 days.  Drink enough fluid to keep your urine clear or pale yellow.  Cover yourmouth and nosewhen coughing or sneezing,and wash your handswellto prevent thevirusfrom spreading.  Stay homefromwork orschool untilthe fever is gonefor at least 72full day. PREVENTION  An annual influenza vaccination (flu shot) is the best way to avoid getting influenza. An annual flu shot is now routinely recommended for all adults in the [place] IF:  You experiencechest pain, yourcough worsens,or you producemore mucus.  Youhave nausea,vomiting, ordiarrhea.  Your fever returns or gets worse. SEEK IMMEDIATE MEDICAL CARE IF:  You havetrouble breathing, you become short of breath,or your skin ornails becomebluish.  You have severe painor stiffnessin the neck.  You develop a sudden headache, or pain in the face or ear.  You have nausea or vomiting that you cannot control. MAKE SURE YOU:   Understand these instructions.  Will watch your condition.  Will get help right away if you are not doing well or get worse.   This information is not intended to replace advice given to you by your health care provider. Make sure you discuss any questions you have with your health care provider.   Document Released: 08/19/2000 Document Revised: 09/12/2014 Document Reviewed: 11/21/2011 Elsevier Interactive Patient Education 2016 [employer].    Smoking Cessation, Tips for Success If you are ready to quit smoking, congratulations! You have chosen to help yourself be healthier. Cigarettes bring nicotine, tar, carbon monoxide, and other irritants into your  body. Your lungs, heart, and blood vessels will be able to work better without these poisons. There are many different ways to quit smoking. Nicotine gum, nicotine patches, a nicotine inhaler, or nicotine nasal  spray can help with physical craving. Hypnosis, support groups, and medicines help break the habit of smoking. WHAT THINGS CAN I DO TO MAKE QUITTING EASIER?  Here are some tips to help you quit for good:  Pick a date when you will quit smoking completely. Tell all of your friends and family about your plan to quit on that date.  Do not try to slowly cut down on the number of cigarettes you are smoking. Pick a quit date and quit smoking completely starting on that day.  Throw away all cigarettes.   Clean and remove all ashtrays from your home, work, and car.  On a card, write down your reasons for quitting. Carry the card with you and read it when you get the urge to smoke.  Cleanse your body of nicotine. Drink enough water and fluids to keep your urine clear or pale yellow. Do this after quitting to flush the nicotine from your body.  Learn to predict your moods. Do not let a bad situation be your excuse to have a cigarette. Some situations in your life might tempt you into wanting a cigarette.  Never have "just one" cigarette. It leads to wanting another and another. Remind yourself of your decision to quit.  Change habits associated with smoking. If you smoked while driving or when feeling stressed, try other activities to replace smoking. Stand up when drinking your coffee. Brush your teeth after eating. Sit in a different chair when you read the paper. Avoid alcohol while trying to quit, and try to drink fewer caffeinated beverages. Alcohol and caffeine may urge you to smoke.  Avoid foods and drinks that can trigger a desire to smoke, such as sugary or spicy foods and alcohol.  Ask people who smoke not to smoke around you.  Have something planned to do right after eating or having a cup of coffee. For example, plan to take a walk or exercise.  Try a relaxation exercise to calm you down and decrease your stress. Remember, you may be tense and nervous for the first 2 weeks after you  quit, but this will pass.  Find new activities to keep your hands busy. Play with a pen, coin, or rubber band. Doodle or draw things on paper.  Brush your teeth right after eating. This will help cut down on the craving for the taste of tobacco after meals. You can also try mouthwash.   Use oral substitutes in place of cigarettes. Try using lemon drops, carrots, cinnamon sticks, or chewing gum. Keep them handy so they are available when you have the urge to smoke.  When you have the urge to smoke, try deep breathing.  Designate your home as a nonsmoking area.  If you are a heavy smoker, ask your health care provider about a prescription for nicotine chewing gum. It can ease your withdrawal from nicotine.  Reward yourself. Set aside the cigarette money you save and buy yourself something nice.  Look for support from others. Join a support group or smoking cessation program. Ask someone at home or at work to help you with your plan to quit smoking.  Always ask yourself, "Do I need this cigarette or is this just a reflex?" Tell yourself, "Today, I choose not to smoke," or "I do not want  to smoke." You are reminding yourself of your decision to quit.  Do not replace cigarette smoking with electronic cigarettes (commonly called e-cigarettes). The safety of e-cigarettes is unknown, and some may contain harmful chemicals.  If you relapse, do not give up! Plan ahead and think about what you will do the next time you get the urge to smoke. HOW WILL I FEEL WHEN I QUIT SMOKING? You may have symptoms of withdrawal because your body is used to nicotine (the addictive substance in cigarettes). You may crave cigarettes, be irritable, feel very hungry, cough often, get headaches, or have difficulty concentrating. The withdrawal symptoms are only temporary. They are strongest when you first quit but will go away within 10-14 days. When withdrawal symptoms occur, stay in control. Think about your reasons for  quitting. Remind yourself that these are signs that your body is healing and getting used to being without cigarettes. Remember that withdrawal symptoms are easier to treat than the major diseases that smoking can cause.  Even after the withdrawal is over, expect periodic urges to smoke. However, these cravings are generally short lived and will go away whether you smoke or not. Do not smoke! WHAT RESOURCES ARE AVAILABLE TO HELP ME QUIT SMOKING? Your health care provider can direct you to community resources or hospitals for support, which may include:  Group support.  Education.  Hypnosis.  Therapy.   This information is not intended to replace advice given to you by your health care provider. Make sure you discuss any questions you have with your health care provider.   Document Released: 05/20/2004 Document Revised: 09/12/2014 Document Reviewed: 02/07/2013 Elsevier Interactive Patient Education [employer].

## 2016-05-01 ENCOUNTER — Encounter (HOSPITAL_COMMUNITY): Payer: Self-pay | Admitting: Vascular Surgery

## 2016-05-01 ENCOUNTER — Emergency Department (HOSPITAL_COMMUNITY)
Admission: EM | Admit: 2016-05-01 | Discharge: 2016-05-01 | Disposition: A | Discharge status: Home / Self Care | Payer: 59 | Attending: Emergency Medicine | Admitting: Emergency Medicine

## 2016-05-01 DIAGNOSIS — R339 Retention of urine, unspecified: Secondary | ICD-10-CM | POA: Diagnosis not present

## 2016-05-01 DIAGNOSIS — F17210 Nicotine dependence, cigarettes, uncomplicated: Secondary | ICD-10-CM | POA: Diagnosis not present

## 2016-05-01 DIAGNOSIS — Z79899 Other long term (current) drug therapy: Secondary | ICD-10-CM | POA: Diagnosis not present

## 2016-05-01 NOTE — Discharge Instructions (Signed)
Follow up with the urologist in a week. Give then a call on Monday.

## 2016-05-01 NOTE — ED Notes (Signed)
Pt and family understood dc material. NAD noted 
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
## 2016-05-01 NOTE — ED Triage Notes (Signed)
Pt reports today for eval of urinary retention since 1300 today. He has hx of this and had to have a catheter placed to have the urine drained. Pt clearly uncomfortable. Denies any hematuria earlier. States he has had pain with it before. PT is currently on Tamsulosin but is not having relief.

## 2016-05-01 NOTE — ED Notes (Signed)
Per Dr. Tyrone Nine, Foley Cath inserted with RN-Kevin B. Assistance, because Urine Bladder Scanner results Greater than 956ml. 16Fr inserted. Pt tolerated well with relief after insertion. 730ml output at this time.

## 2016-05-02 LAB — URINE CULTURE: Culture: NO GROWTH

## 2016-05-03 ENCOUNTER — Ambulatory Visit (INDEPENDENT_AMBULATORY_CARE_PROVIDER_SITE_OTHER): Payer: 59 | Admitting: Physician Assistant

## 2016-05-03 ENCOUNTER — Telehealth: Payer: Self-pay | Admitting: *Deleted

## 2016-05-03 VITALS — BP 128/80 | HR 88 | Temp 97.9°F | Resp 17 | Ht 68.5 in | Wt 183.0 lb

## 2016-05-03 DIAGNOSIS — R339 Retention of urine, unspecified: Secondary | ICD-10-CM

## 2016-05-03 DIAGNOSIS — R300 Dysuria: Secondary | ICD-10-CM

## 2016-05-03 LAB — POCT URINALYSIS DIP (MANUAL ENTRY)
Bilirubin, UA: NEGATIVE
GLUCOSE UA: NEGATIVE
Ketones, POC UA: NEGATIVE
Leukocytes, UA: NEGATIVE
NITRITE UA: NEGATIVE
Protein Ur, POC: 100 — AB
Spec Grav, UA: 1.02
UROBILINOGEN UA: 0.2
pH, UA: 5.5

## 2016-05-03 LAB — POC MICROSCOPIC URINALYSIS (UMFC): Mucus: ABSENT

## 2016-05-03 NOTE — Progress Notes (Signed)
Bobby [name]  MRN: [id number] DOB: June 25, 1951  Subjective:  Pt presents to clinic with urinary problems for the last 2 days.  He was seen in the ED on 8/27 for urinary retention - a urine culture was done which was negative and a foley was placed.  He is here today for a referral to urology as he has been unable to make an appt himself.  Currently the pain in his bladder is much improved.  He last emptied the urine bag this am around but he thinks it is a small amount but he is not drinking that much fluids.  He has had this happen in the past and he has seen [facility] but he cannot make an appt.  [place] interpretor through [place] [facility] [place]  Review of Systems  Constitutional: Negative for chills and fever.  Genitourinary: Positive for difficulty urinating. Negative for dysuria.  Musculoskeletal: Positive for back pain (since the start of his urinary retention).    Patient Active Problem List   Diagnosis Date Noted  . Prostatitis, acute 01/30/2015  . Pain in joint, shoulder region 01/30/2015  . Osteoarthritis 01/02/2015    Current Outpatient Prescriptions on File Prior to Visit  Medication Sig Dispense Refill  . tamsulosin (FLOMAX) 0.4 MG CAPS capsule Take 0.8 mg by mouth daily.      No current facility-administered medications on file prior to visit.     No Known Allergies  Pt patients past, family and social history were reviewed and updated.  Objective:  BP 128/80 (BP Location: Right Arm, Patient Position: Sitting, Cuff Size: Normal)   Pulse 88   Temp 97.9 F (36.6 C) (Oral)   Resp 17   Ht 5' 8.5" (1.74 m)   Wt 183 lb (83 kg)   SpO2 98%   BMI 27.42 kg/m   Physical Exam  Constitutional: He is oriented to person, place, and time and well-developed, well-nourished, and in no distress.  HENT:  Head: Normocephalic and atraumatic.  Right Ear: External ear normal.  Left Ear: External ear normal.  Eyes: Conjunctivae are normal.  Neck: Normal  range of motion.  Pulmonary/Chest: Effort normal.  Genitourinary:  Genitourinary Comments: Catheter in place  Neurological: He is alert and oriented to person, place, and time. Gait normal.  Skin: Skin is warm and dry.  Psychiatric: Mood, memory, affect and judgment normal.   Results for orders placed or performed in visit on 05/03/16  POCT urinalysis dipstick  Result Value Ref Range   Color, UA yellow yellow   Clarity, UA cloudy (A) clear   Glucose, UA negative negative   Bilirubin, UA negative negative   Ketones, POC UA negative negative   Spec Grav, UA 1.020    Blood, UA moderate (A) negative   pH, UA 5.5    Protein Ur, POC =100 (A) negative   Urobilinogen, UA 0.2    Nitrite, UA Negative Negative   Leukocytes, UA Negative Negative  POCT Microscopic Urinalysis ([facility])  Result Value Ref Range   WBC,UR,HPF,POC Few (A) None WBC/hpf   RBC,UR,HPF,POC Too numerous to count  (A) None RBC/hpf   Bacteria Moderate (A) None, Too numerous to count   Mucus Absent Absent   Epithelial Cells, UR Per Microscopy Few (A) None, Too numerous to count cells/hpf    Assessment and Plan :  Dysuria - Plan: POCT urinalysis dipstick, POCT Microscopic Urinalysis ([facility])  Urinary retention   Hematuria - catheter urine - no urine to compare with at the  ED visit.  Pt will continue his Flomax while waiting on his Urology visit.  Called and spoke with [facility] - he had an appt but it was not from a referral from his PCP so they had to cancel it - we will attempt to make the referral as we are FP but we are not his PCP - if we are unable to do it he will need to be seen by his PCP.  He understands the plan.  Windell Hummingbird PA-C  [facility] 05/03/2016 12:05 PM

## 2016-05-03 NOTE — Telephone Encounter (Signed)
Pre auth received [id number].  Patient was scheduled for 2pm 05/04/16 at [facility].  He could not go and wanted earlier appt.  Patient will call now and schedule appt for earlier time.

## 2016-05-03 NOTE — Patient Instructions (Addendum)
Continue Flomax until you see urology.  [facility] appt will be scheduled once we talk with your insurance     IF you received an x-ray today, you will receive an invoice from [facility]. Please contact [facility] at [phone] with questions or concerns regarding your invoice.   IF you received labwork today, you will receive an invoice from [employer]. Please contact [facility] at [phone] with questions or concerns regarding your invoice.   Our billing staff will not be able to assist you with questions regarding bills from these companies.  You will be contacted with the lab results as soon as they are available. The fastest way to get your results is to activate your My Chart account. Instructions are located on the last page of this paperwork. If you have not heard from [place] regarding the results in 2 weeks, please contact this office.

## 2016-05-10 ENCOUNTER — Ambulatory Visit (INDEPENDENT_AMBULATORY_CARE_PROVIDER_SITE_OTHER): Payer: 59 | Admitting: Family Medicine

## 2016-05-10 VITALS — BP 126/80 | HR 97 | Temp 98.8°F | Resp 17 | Ht 68.5 in | Wt 178.0 lb

## 2016-05-10 DIAGNOSIS — R339 Retention of urine, unspecified: Secondary | ICD-10-CM | POA: Diagnosis not present

## 2016-05-10 DIAGNOSIS — R300 Dysuria: Secondary | ICD-10-CM | POA: Diagnosis not present

## 2016-05-10 DIAGNOSIS — R748 Abnormal levels of other serum enzymes: Secondary | ICD-10-CM | POA: Diagnosis not present

## 2016-05-10 DIAGNOSIS — N390 Urinary tract infection, site not specified: Secondary | ICD-10-CM

## 2016-05-10 DIAGNOSIS — R972 Elevated prostate specific antigen [PSA]: Secondary | ICD-10-CM | POA: Diagnosis not present

## 2016-05-10 LAB — POCT URINALYSIS DIP (MANUAL ENTRY)
GLUCOSE UA: NEGATIVE
Ketones, POC UA: NEGATIVE
NITRITE UA: POSITIVE — AB
PH UA: 6
Protein Ur, POC: 30 — AB
Spec Grav, UA: 1.01

## 2016-05-10 LAB — CBC WITH DIFFERENTIAL/PLATELET
BASOS ABS: 214 {cells}/uL — AB (ref 0–200)
Basophils Relative: 2 %
EOS ABS: 0 {cells}/uL — AB (ref 15–500)
Eosinophils Relative: 0 %
HEMATOCRIT: 31.1 % — AB (ref 38.5–50.0)
Hemoglobin: 10.9 g/dL — ABNORMAL LOW (ref 13.2–17.1)
LYMPHS PCT: 12 %
Lymphs Abs: 1284 cells/uL (ref 850–3900)
MCH: 21.1 pg — ABNORMAL LOW (ref 27.0–33.0)
MCHC: 35 g/dL (ref 32.0–36.0)
MCV: 60.2 fL — AB (ref 80.0–100.0)
MONO ABS: 535 {cells}/uL (ref 200–950)
Monocytes Relative: 5 %
NEUTROS PCT: 81 %
Neutro Abs: 8667 cells/uL — ABNORMAL HIGH (ref 1500–7800)
Platelets: 151 10*3/uL (ref 140–400)
RBC: 5.17 MIL/uL (ref 4.20–5.80)
RDW: 16.1 % — AB (ref 11.0–15.0)
WBC: 10.7 10*3/uL (ref 3.8–10.8)

## 2016-05-10 LAB — COMPLETE METABOLIC PANEL WITH GFR
ALBUMIN: 3.4 g/dL — AB (ref 3.6–5.1)
ALT: 97 U/L — ABNORMAL HIGH (ref 9–46)
AST: 61 U/L — AB (ref 10–35)
Alkaline Phosphatase: 195 U/L — ABNORMAL HIGH (ref 40–115)
BUN: 12 mg/dL (ref 7–25)
CHLORIDE: 96 mmol/L — AB (ref 98–110)
CO2: 26 mmol/L (ref 20–31)
Calcium: 8.4 mg/dL — ABNORMAL LOW (ref 8.6–10.3)
Creat: 0.97 mg/dL (ref 0.70–1.25)
GFR, Est African American: >89 mL/min (ref >=60)
GFR, Est Non African American: 82 mL/min (ref >=60)
GLUCOSE: 92 mg/dL (ref 65–99)
POTASSIUM: 3.8 mmol/L (ref 3.5–5.3)
SODIUM: 133 mmol/L — AB (ref 135–146)
Total Bilirubin: 1.7 mg/dL — ABNORMAL HIGH (ref 0.2–1.2)
Total Protein: 7.1 g/dL (ref 6.1–8.1)

## 2016-05-10 LAB — POC MICROSCOPIC URINALYSIS (UMFC): MUCUS RE: ABSENT

## 2016-05-10 LAB — PSA: PSA: 18.8 ng/mL — ABNORMAL HIGH (ref <=4.0)

## 2016-05-10 MED ORDER — CIPROFLOXACIN HCL 500 MG PO TABS: 500.0000 mg | ORAL_TABLET | Freq: Two times a day (BID) | ORAL | 0 refills | Status: AC

## 2016-05-10 NOTE — Progress Notes (Signed)
Patient ID: Bobby [name], male    DOB: Sep 16, 1950, 65 y.o.   MRN: [id number]  PCP: Georges Lynch, MD  Chief Complaint  Patient presents with  . Chills  . Hot Flashes  . Nasal Congestion    Subjective:   HPI Presents for evaluation of urinary retention and dysuria.  65 year old male presents reporting that he feels extremely ill,  pain with urination, urinary retention, and noticing what he thought was red blood and dark brown color of  urine.  He also describes having chills, bilateral flank pain, and feeling rather feverish. He was recently seen at [facility] ED 8.27.17 and at [facility] 8.29.17 with similar symptoms.  There was no evidence of urinary tract infection at that time.  He had a foley catheter placed on 8.27.17 and presents today without catheter.  He reports having an appointment with "a doctor" and foley was removed.  He reports missing several days of work due to feeling tired and very sick.   Social History   Social History  . Marital status: Married    Spouse name: N/A  . Number of children: N/A  . Years of education: N/A   Occupational History  . Not on file.   Social History Main Topics  . Smoking status: Current Every Day Smoker    Packs/day: 1.00    Types: Cigarettes  . Smokeless tobacco: Never Used  . Alcohol use 4.2 oz/week    7 Cans of beer per week  . Drug use: No  . Sexual activity: Yes   Other Topics Concern  . Not on file   Social History Narrative   Married with 2 daughters and 1 son   No family history on file.    Review of Systems  Constitutional: Positive for chills and fatigue.  Respiratory: Negative.   Cardiovascular: Negative.   Gastrointestinal: Negative for abdominal pain, nausea and vomiting.  Musculoskeletal: Positive for back pain.       Left and right flank pain.  Neurological: Negative for headaches.       Patient Active Problem List   Diagnosis Date Noted  . Prostatitis, acute 01/30/2015  . Pain in joint, shoulder region  01/30/2015  . Osteoarthritis 01/02/2015     Prior to Admission medications   Medication Sig Start Date End Date Taking? Authorizing Provider  ibuprofen (ADVIL,MOTRIN) 200 MG tablet Take 200 mg by mouth every 6 (six) hours as needed.   Yes Historical Provider, MD  tamsulosin (FLOMAX) 0.4 MG CAPS capsule Take 0.8 mg by mouth daily.    Yes Historical Provider, MD     No Known Allergies     Objective:  Physical Exam  Constitutional: He is oriented to person, place, and time.  Ill-appearing.  Negative of distress.  HENT:  Head: Normocephalic and atraumatic.  Right Ear: External ear normal.  Left Ear: External ear normal.  Eyes: Conjunctivae and EOM are normal. Pupils are equal, round, and reactive to light.  Cardiovascular: Normal rate, regular rhythm, normal heart sounds and intact distal pulses.   Pulmonary/Chest: Effort normal and breath sounds normal.  Abdominal: Soft. Bowel sounds are normal. He exhibits no distension and no mass. There is no tenderness. There is no rebound and no guarding.  Negative CVA tenderness with percussion.  Musculoskeletal: Normal range of motion.  Neurological: He is alert and oriented to person, place, and time.  Skin: Skin is warm and dry.  Psychiatric: He has a normal mood and affect. Thought content normal.   Vitals:  05/10/16 0920  BP: 126/80  Pulse: 97  Resp: 17  Temp: 98.8 F (37.1 C)     Assessment & Plan:  1. Dysuria 2. Urinary retention - POCT Microscopic Urinalysis ([facility])-abnormal - POCT urinalysis dipstick-abnormal  - COMPLETE METABOLIC PANEL WITH GFR - CBC with Differential/Platelet-evaluate for elevated white count/left shift - PSA - Urine culture  Patient has been seen and evaluated for complaints of dysuria and urinary retention, intermittently over the last year.  He has been treated periodically for urinary retention with catheterizations. He presents today, appearing rather ill.  He is afebrile, microscopic urine is  positive for urinary tract infection.  I am concerned that there is an underlying issue as patient has experienced intermittent urinary retention with dysuria starting back in May 2016.  He did receive a CT scan of the abdomen and pelvis related to right sided abdominal pain back in July 2016.  The CT scan revealed an enlarged prostate gland.     Plan: Elevated PSA level.  Patient is already being followed by [facility] and they can further evaluate underlying cause of elevated PSA.   Urinary tract infection complicated-treat infection with ciprofloxacin 500 mg twice a day for 10 days.  A follow-up office appointment with [facility] was scheduled while patient was in office today for Thursday, 05/12/2016.  Patient advised the importance of attending this appointment.  Comprehensive Metabolic Panel w/GFR revealed elevated liver enzymes and alkaline phosphatase.  Ordered a liver ultrasound.   Carroll Sage. Kenton Kingfisher, MSN, FNP-C [facility]

## 2016-05-10 NOTE — Patient Instructions (Addendum)
Start antibiotic today, Clindamycin 500 mg twice per day for 10 days. Complete all medication. Please follow-up with urology immediately! Increase fluid intake.     IF you received an x-ray today, you will receive an invoice from [facility]. Please contact [facility] at [phone] with questions or concerns regarding your invoice.   IF you received labwork today, you will receive an invoice from [employer]. Please contact [facility] at [phone] with questions or concerns regarding your invoice.   Our billing staff will not be able to assist you with questions regarding bills from these companies.  You will be contacted with the lab results as soon as they are available. The fastest way to get your results is to activate your My Chart account. Instructions are located on the last page of this paperwork. If you have not heard from [place] regarding the results in 2 weeks, please contact this office.

## 2016-05-12 LAB — URINE CULTURE

## 2016-05-17 ENCOUNTER — Telehealth: Payer: Self-pay

## 2016-05-17 NOTE — Telephone Encounter (Signed)
I ordered the ultrasound and never advised patient not to obtain the study.  The reason for the study is to evaluate his liver due to elevated liver enzymes.  Please call the patient to advise of the reason for diagnostic study.  Carroll Sage. Kenton Kingfisher, MSN, FNP-C [facility]

## 2016-05-17 NOTE — Telephone Encounter (Signed)
[place] of abdomen scheduled at [facility] for 9/15 at 7:30am. Called to inform patient of apt. Patient stated that he was called previously & told that [place] was no longer necessary. Did not see any documentation of this encounter. Please advise.    Note for follow up call with patient: Arrive at 7:15, nothing to eat/drink after midnight. 1st floor radiology.

## 2016-05-20 ENCOUNTER — Ambulatory Visit (HOSPITAL_COMMUNITY): Payer: 59
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
## 2017-02-13 ENCOUNTER — Ambulatory Visit (INDEPENDENT_AMBULATORY_CARE_PROVIDER_SITE_OTHER): Payer: Medicare HMO | Admitting: Emergency Medicine

## 2017-02-13 ENCOUNTER — Encounter: Payer: Self-pay | Admitting: Emergency Medicine

## 2017-02-13 VITALS — BP 146/73 | HR 87 | Temp 98.7°F | Resp 16 | Ht 68.0 in | Wt 184.8 lb

## 2017-02-13 DIAGNOSIS — S39012A Strain of muscle, fascia and tendon of lower back, initial encounter: Secondary | ICD-10-CM | POA: Diagnosis not present

## 2017-02-13 MED ORDER — DICLOFENAC SODIUM 75 MG PO TBEC: 75.0000 mg | DELAYED_RELEASE_TABLET | Freq: Two times a day (BID) | ORAL | 0 refills | Status: AC
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
## 2017-02-13 NOTE — Patient Instructions (Addendum)
     IF you received an x-ray today, you will receive an invoice from [facility]. Please contact [facility] at [phone] with questions or concerns regarding your invoice.   IF you received labwork today, you will receive an invoice from [facility]. Please contact [facility] at [phone] with questions or concerns regarding your invoice.   Our billing staff will not be able to assist you with questions regarding bills from these companies.  You will be contacted with the lab results as soon as they are available. The fastest way to get your results is to activate your My Chart account. Instructions are located on the last page of this paperwork. If you have not heard from us regarding the results in 2 weeks, please contact this office.      Back Pain, Adult Back pain is very common. The pain often gets better over time. The cause of back pain is usually not dangerous. Most people can learn to manage their back pain on their own. Follow these instructions at home: Watch your back pain for any changes. The following actions may help to lessen any pain you are feeling:  Stay active. Start with short walks on flat ground if you can. Try to walk farther each day.  Exercise regularly as told by your doctor. Exercise helps your back heal faster. It also helps avoid future injury by keeping your muscles strong and flexible.  Do not sit, drive, or stand in one place for more than 30 minutes.  Do not stay in bed. Resting more than 1-2 days can slow down your recovery.  Be careful when you bend or lift an object. Use good form when lifting: ? Bend at your knees. ? Keep the object close to your body. ? Do not twist.  Sleep on a firm mattress. Lie on your side, and bend your knees. If you lie on your back, put a pillow under your knees.  Take medicines only as told by your doctor.  Put ice on the injured area. ? Put ice in a plastic bag. ? Place a towel between your  skin and the bag. ? Leave the ice on for 20 minutes, 2-3 times a day for the first 2-3 days. After that, you can switch between ice and heat packs.  Avoid feeling anxious or stressed. Find good ways to deal with stress, such as exercise.  Maintain a healthy weight. Extra weight puts stress on your back.  Contact a doctor if:  You have pain that does not go away with rest or medicine.  You have worsening pain that goes down into your legs or buttocks.  You have pain that does not get better in one week.  You have pain at night.  You lose weight.  You have a fever or chills. Get help right away if:  You cannot control when you poop (bowel movement) or pee (urinate).  Your arms or legs feel weak.  Your arms or legs lose feeling (numbness).  You feel sick to your stomach (nauseous) or throw up (vomit).  You have belly (abdominal) pain.  You feel like you may pass out (faint). This information is not intended to replace advice given to you by your health care provider. Make sure you discuss any questions you have with your health care provider. Document Released: 02/08/2008 Document Revised: 01/28/2016 Document Reviewed: 12/24/2013 Elsevier Interactive Patient Education  2018 Elsevier Inc.  

## 2017-04-07 ENCOUNTER — Ambulatory Visit (INDEPENDENT_AMBULATORY_CARE_PROVIDER_SITE_OTHER): Payer: Worker's Compensation

## 2017-04-07 ENCOUNTER — Ambulatory Visit (INDEPENDENT_AMBULATORY_CARE_PROVIDER_SITE_OTHER): Payer: Worker's Compensation | Admitting: Urgent Care

## 2017-04-07 ENCOUNTER — Encounter: Payer: Self-pay | Admitting: Urgent Care

## 2017-04-07 VITALS — BP 140/80 | HR 76 | Temp 98.0°F | Resp 18 | Ht 67.72 in | Wt 180.0 lb

## 2017-04-07 DIAGNOSIS — S97111A Crushing injury of right great toe, initial encounter: Secondary | ICD-10-CM

## 2017-04-07 DIAGNOSIS — Z026 Encounter for examination for insurance purposes: Secondary | ICD-10-CM | POA: Diagnosis not present

## 2017-04-07 DIAGNOSIS — S91111A Laceration without foreign body of right great toe without damage to nail, initial encounter: Secondary | ICD-10-CM

## 2017-04-07 DIAGNOSIS — S92424A Nondisplaced fracture of distal phalanx of right great toe, initial encounter for closed fracture: Secondary | ICD-10-CM | POA: Diagnosis not present

## 2017-04-07 DIAGNOSIS — Z23 Encounter for immunization: Secondary | ICD-10-CM

## 2017-04-07 DIAGNOSIS — S99921A Unspecified injury of right foot, initial encounter: Secondary | ICD-10-CM | POA: Diagnosis not present

## 2017-04-07 MED ORDER — CEPHALEXIN 500 MG PO CAPS: 500.0000 mg | ORAL_CAPSULE | Freq: Three times a day (TID) | ORAL | 0 refills | Status: DC

## 2017-04-07 NOTE — Progress Notes (Signed)
MRN: [id number] DOB: [birth year]  Subjective:   Bobby [name] is a 66 y.o. male presenting for worker's comp visit.   Reports suffering a right foot injury while at work today. Patient was holding a heavy metal object above his head, dropped it and the metal subsequently landed on his right great toe. He has since had moderate-severe pain over his wound. He used alcohol to clean his wound, placed a band aid over it. Has tried Advil for pain and inflammation with some relief. Denies loss of range of motion, weakness, numbness or tingling. Cannot recall last time he had a td.   K's medications list, allergies, past medical history and past surgical history were reviewed and excluded from this note due to being a worker's comp case.   Objective:   Vitals: BP 140/80   Pulse 76   Temp 98 F (36.7 C) (Oral)   Resp 18   Ht 5' 7.72" (1.72 m)   Wt 180 lb (81.6 kg)   SpO2 98%   BMI 27.60 kg/m   Physical Exam  Constitutional: He is oriented to person, place, and time. He appears well-developed and well-nourished.  Cardiovascular: Normal rate.   Pulmonary/Chest: Effort normal.  Musculoskeletal:       Right foot: There is tenderness (over wound), bony tenderness (between PIP and DIP of right great toe) and laceration (shave laceration of dorsal aspect of right great toe). There is normal range of motion, no swelling, normal capillary refill, no crepitus and no deformity.  Neurological: He is alert and oriented to person, place, and time.  Skin: Skin is warm and dry.   Dg Foot 2 Views Right  Addendum Date: 04/07/2017   ADDENDUM REPORT: 04/07/2017 10:47 ADDENDUM: The oblique projection of the other study performed today does demonstrate possible nondisplaced fracture involving the proximal base of the first distal phalanx. Electronically Signed   By: Marijo Conception, M.D.   On: 04/07/2017 10:47   Result Date: 04/07/2017 CLINICAL DATA:  Right great toe crush injury. EXAM: RIGHT FOOT - 2 VIEW COMPARISON:   None. FINDINGS: There is no evidence of fracture or dislocation. There is no evidence of arthropathy. Mild posterior calcaneal spurring is noted. Soft tissues are unremarkable. IMPRESSION: No acute abnormality seen in the right foot. Electronically Signed: By: Marijo Conception, M.D. On: 04/07/2017 10:44   Dg Toe Great Right  Result Date: 04/07/2017 CLINICAL DATA:  Right great toe crush injury. EXAM: RIGHT GREAT TOE COMPARISON:  None. FINDINGS: There is a possible nondisplaced fracture involving the proximal base of the first distal phalanx. No other bony abnormality is noted. Joint spaces are intact. IMPRESSION: Possible nondisplaced fracture involving the first distal phalanx. Electronically Signed   By: Marijo Conception, M.D.   On: 04/07/2017 10:47   PROCEDURE NOTE: laceration repair Verbal consent obtained from patient.  Local anesthesia with 1cc Lidocaine 2% without epinephrine.  Wound explored for tendon, ligament damage. Wound scrubbed with soap and water and rinsed. Wound closed with Dermabond. Wound cleansed and dressed.   Assessment and Plan :   1. Closed nondisplaced fracture of distal phalanx of right great toe, initial encounter 2. Laceration of right great toe without foreign body present or damage to nail, initial encounter 3. Crushing injury of right great toe, initial encounter 4. Need for Td vaccine 5. Encounter related to worker's compensation claim - Laceration repaired. Closed non-displaced toe fracture will be managed with buddy tape system, hard soled shoe. APAP and NSAID for pain  and inflammation. RTC in 1 week for recheck. Td updated today. Work restrictions provided.  Jaynee Eagles, PA-C [facility] [phone] 04/07/2017 10:22 AM

## 2017-04-07 NOTE — Patient Instructions (Addendum)
You may take 500mg  Tylenol with ibuprofen 400mg  every 6 hours for pain and inflammation. If you have kidney disease do not take ibuprofen.    Gy x??ng ngn chn (Toe Fracture) Gy x??ng ngn chn l gy ? m?t trong cc x??ng ngn chn (??t ngn). NGUYN NHN Tnh tr?ng ny c th? do:  Lm r?i m?t v?t n?ng vo ngn chn.  V?p ngn chn.  S? d?ng ngn chn qu m?c ho?c t?p bi t?p l?p ?i l?p l?i  Xo?n ho?c ko ngn chn tr?t kh?i v? tr. CC Y?U T? NGUY C? B?nh ny hay x?y ra h?n ? nh?ng ng??i:  Ch?i nh?ng mn th? thao ti?p xc.  C m?t b?nh l ? x??ng.  C n?ng ?? can xi th?p. TRI?U CH?NG Nh?ng tri?u ch?ng chnh c?a b?nh ny l s?ng v ?au ? ngn chn. C?n ?au c th? tr?m tr?ng h?n khi ??ng ho?c ?i l?i. Cc tri?u ch?ng khc bao g?m:  B?m tm.  C?ng kh?p.  T b.  Thay ??i b? ngoi ngn chn.  X??ng gy chi kh?i da.  Ch?y mu d??i mng chn. CH?N ?ON Tnh tr?ng ny c th? ???c ch?n ?on b?ng cch khm th?c th?[name] v? c?ng c th? ???c ch?p X quang. ?I?U TR? Vi?c ?i?u tr? b?nh ny ty thu?c vo lo?i gy x??ng v m?c ?? n?ng c?a b?nh. ?i?u tr? c th? bao g?m:  Bu?c ngn chn b? gy v?i ngn chn bn c?nh (bu?c v?i ngn bn c?nh). ?y l bi?n php ?i?u tr? ph? bi?n nh?t cho nh?ng lo?i gy x??ng m x??ng v?n ch?a b? tr?t kh?i ch? ban ??u (g?y x??ng khng tr?t).  ?i gi?y c ?? r?ng, c?ng ?? b?o v? ngn chn v h?n ch? c? ??ng c?a n.  B b?t c? ??nh chn.  Ti?n hnh th? thu?t ?? ??a ngn chn vo ?ng v? tr.  Ph?u thu?t. Ph?u thu?t c th? c?n thi?t: ? N?u c b?t k? m?u x??ng v? no b? tr?t kh?i v? tr(tr?t x??ng). ? N?u kh?p ngn chn b? v?. ? N?u x??ng gy ch?c th?ng da.  V?t l tr? li?u. Bi?n php ny ???c th?c hi?n ?? h?i ph?c kh? n?ng c? ??ng v s?c m?nh c?a ngn chn. Qu v? c th? c?n ch?p X quang ki?m tra l?i ?? b?o ??m x??ng ?ang li?n l?i t?t v n?m ?ng v? tr. H??NG D?N CH?M [place] T?I NH N?u qu v? ???c b b?t:  Khng ch?c b?t k? th? g vo trong  b?t ?? gi ch? ng?a. Lm nh? v?y s? t?ng nguy c? nhi?m trng.  Ki?m tra ch? da [place] l?p b?t m?i ngy. Bo co cho chuyn gia ch?m [place] s?c kh?e khi c b?t k? lo ng?i no. Qu v? c th? bi kem d??ng da ln vng da kh xung quanh cc mp c?a l?p b?t. Khng bi kem d??ng da vo vng da ? pha d??i l?p b?t.  Khng t? ? vo b?t k? ph?n no c?a b?t cho ??n khi b?t c?ng hon ton. Vi?c ny c th? m?t vi gi?Fabienne Bruns? cho b?t s?ch s? v kh. T?m b?ng b?n  Khng t?m b?n, b?i, ho?c s? d?ng b?n t?m n??c nng cho ??n khi ???c chuyn gia ch?m [place] s?c kh?e c?a qu v? ch?p thu?n. Hy h?i chuyn gia ch?m [place] s?c kh?e xem qu v? c th? t?m vi hoa sen khng. Qu v? c th? ch? ???c t?m b?ng mi?ng b?t bi?n.  N?u chuyn gia  ch?m [place] s?c kh?e ch?p thu?n cho qu v? t?m b?n ho?c t?m vi hoa sen, hy che l?p b?t hay mi?ng b?ng (b?ng) b?ng m?t ti nh?a kn ?? trnh th?m n??c. Khng ?? l?p b?t ho?c mi?ng b?ng b? ??t. Gi?m ?au, c?ng v s?ng  N?u qu v? khng b b?t, hy ch??m ? vo vng b? t?n th??ng, n?u ???c h??ng d?n. ? Cho ? l?nh vo ti nh?a. ? ?? kh?n t?m ? [place] v ti ch??m. ? Ch??m ? l?nh trong kho?ng 20 pht, 2-3 l?n m?i ngy.  C? ??ng ngn chn th??ng xuyn ?? trnh c?ng kh?p v lm gi?m s?ng.  Nng (nng cao) ch? b? th??ng ln cao h?n tim khi qu v? ng?i ho?c n?m. Li xe  Khng li xe ho?c v?n hnh my mc h?ng n?ng khi dng thu?c gi?m ?au.  Khng li xe khi b b?t ? bn chn m qu v? dng ?? li xe. Ho?t ??ng  Tr? l?i ho?t ??ng bnh th??ng theo ch? d?n c?a chuyn gia ch?m [place] s?c kh?e. H?i chuyn gia ch?m [place] s?c kh?e v? cc ho?t ??ng no an ton cho qu v?.  T?p th? d?c hng ngy theo ch? d?n c?a chuyn gia ch?m [place] s?c kh?e ho?c chuyn gia v?t l tr? li?u. An ton  Khng s? d?ng chn b? th??ng ?? ?? s?c n?ng c? th? c?a qu v? cho ??n khi chuyn gia ch?m [place] s?c kh?e ni c th? lm nh? v?y. S? d?ng n?ng ho?c cc d?ng c? h? tr? khc theo ch? d?n c?a chuyn gia ch?m [place] s?c kh?e. H??ng  d?n chung  N?u ngn chn c?a qu v? ???c ?i?u tr? b?ng cch bu?c vo ngn bn c?nh, hy lm theo h??ng d?n c?a chuyn gia ch?m [place] s?c kh?e v? vi?c thay g?c ho?c dy bu?c. Thay g?c th??ng xuyn h?n: ? N?u g?c v dy bu?c b? ??t. N?u vi?c ny x?y ra, hy lau kh k? gi?a cc ngn chn. ? N?u g?c v dy bu?c qu ch?t v chng lm ngn chn tr?n nn nh?t nh?t ho?c t b.  ?i giy b?o h? theo ch? d?n c?a chuyn gia ch?m [place] s?c kh?e. N?u qu v? khng ???c cho dng giy b?o h?, hy ?i giy c?ng ?? h? tr?. Giy c?a qu v? khng ???c lm ?au ngn chn v khng ???c chn vo ngn chn.  Khng s? d?ng b?t c? s?n ph?m thu?c l no, bao g?m thu?c l d?ng ht, thu?c l d?ng nhai ho?c thu?c l ?i?n t?. Thu?c l c th? lm x??ng li?n ch?m h?n. N?u qu v? c?n gip ?? ?? cai thu?c, hy h?i chuyn gia ch?m [place] s?c kh?e.  Ch? s? d?ng thu?c theo ch? d?n c?a chuyn gia ch?m [place] s?c kh?e.  Tun th? t?t c? cc cu?c h?n khm l?i theo ch? d?n c?a chuyn gia ch?m [place] s?c kh?e. ?i?u ny c vai tr quan tr?ng. ?I KHM N?U:  Qu v? b? s?t.  Thu?c gi?m ?au c?a qu v? khng c tc d?ng.  Ngn chn qu v? b? l?nh.  Ngn chn qu v? b? t b.  Qu v? v?n b? ?au sau m?t tu?n ngh? ng?i v ?i?u tr?[name] v? v?n b? ?au sau khi chuyn gia ch?m [place] s?c kh?e ni r?ng qu v? c th? b?t ??u ?i l?i.  Qu v? b? ?au, t bu?t ho?c t b ? bn chn m khng kh?i. NGAY L?P T?C ?I KHM N?U:  Qu v?  b? ?au r?t nhi?u.  Qu v? b? ?? ho?c vim s?ng ? ngn chn v tnh tr?ng tr?m tr?ng h?n.  Qu v? b? ?au ho?c t b ? ngn chn v tnh tr?ng tr?m tr?ng h?n.  Ngn chn qu v? chuy?n mu xanh. Thng tin ny khng nh?m m?c ?ch thay th? cho l?i khuyn m chuyn gia ch?m [place] s?c kh?e ni v?i qu v?. Hy b?o ??m qu v? ph?i th?o lu?n b?t k? v?n ?? g m qu v? c v?i chuyn gia ch?m [place] s?c kh?e c?a qu v?. Document Released: 08/22/2005 Document Revised: 12/14/2015 Document Reviewed: 06/18/2014 Elsevier Interactive Patient Education   2018 [place] v?t rch, Ng??i l?n (Laceration Care, Adult) V?t rch l m?t v?t c?t xuyn qua t?t c? cc l?p da v vo ph?n m ngay d??i da. M?t s? v?t rch t? li?n l?i. Nh?ng v?t rch khc c?n khu l?i b?ng ch? (khu), ghim k?p, b?ng dnh da, ho?c keo dn da. Vi?c ch?m [place] v?t rch ?ng cch s? gi?m thi?u nguy c? nhi?m trng v gip v?t rch li?n l?i t?t h?n. CH?M [place] V?T RCH C?A QU V? NH? TH? NO N?u c s? d?ng ch? khu ho?c ghim k?p:  Gi? cho v?t th??ng s?ch v kh.  N?u qu v? ???c cho dng mi?ng b?ng (b?ng), qu v? c?n thay b?ng t nh?t m?t l?n m?i ngy ho?c theo ch? d?n c?a chuyn gia ch?m [place] s?c kh?e. Qu v? c?ng c?n thay b?ng khi b?ng b? b?n ho?c ??t.  Gi? cho v?t th??ng hon ton kh trong 24 gi? ??u tin ho?c theo ch? d?n c?a chuyn gia ch?m [place] s?c kh?e. Sau th?i gian ?, qu v? c th? t?m vi hoa sen ho?c t?m b?n. Tuy nhin, hy ch?c ch?n v?t th??ng khng b? ng?p trong n??c cho ??n khi ch? khu ho?c ghim k?p ? ???c tho b?.  Lm s?ch v?t th??ng m?t l?n m?i ngy ho?c theo ch? d?n c?a chuyn gia ch?m [place] s?c kh?e. ? R?a v?t th??ng b?ng x phng v n??c. ? D?i v?t th??ng b?ng n??c ?? r?a s?ch x phng. ? Ch?m kh v?t th??ng b?ng kh?n s?ch. Khngch xt v?t th??ng.  Sau khi lm s?ch v?t th??ng, bi m?t l?p thu?c m? khng sinh m?ngtheo ch? d?n c?a chuyn gia ch?m [place] s?c kh?e. Vi?c ny s? gip ng?n ng?a nhi?m trng v gi? cho mi?ng b?ng khng b? dnh vo v?t th??ng.  Tho ch? ho?c ghim k?p theo ch? d?n c?a chuyn gia ch?m [place] s?c kh?e. N?u c s? d?ng b?ng dnh da:  Gi? cho v?t th??ng s?ch v kh.  N?u qu v? ???c cho dng mi?ng b?ng (b?ng), qu v? c?n thay b?ng t nh?t m?t l?n m?i ngy ho?c theo ch? d?n c?a chuyn gia ch?m  s?c kh?e. Qu v? c?ng c?n thay b?ng khi b?ng b? b?n ho?c ??t.  Khng ?? cc mi?ng b?ng dnh da b? ??t. Qu v? c th? t?m vi hoa sen ho?c t?m b?n, nh?ng hy c?n th?n ?? gi? cho v?t th??ng ???c kh.  N?u v?t th??ng b? ??t, hy ch?m kh  v?t th??ng b?ng kh?n s?ch. Khngch xt v?t th??ng.  Cc mi?ng b?ng dn da t? bong ra. Qu v? c th? c?t g?n mi?ng b?ng khi v?t th??ng lnh l?i. Khng bc mi?ng b?ng dn da v?n cn dnh vo v?t th??ng. D?n d?n chng s? bong ra. N?u c s? d?ng keo dn da:  C? g?ng gi?  cho v?t th??ng kh, nh?ng qu v? c th? lm ??t v?t th??ng ? trong th?i gian ng?n khi t?m vi hoa sen ho?c t?m b?n. Khng ngm v?t th??ng trong n??c, ch?ng h?n nh? b?i l?i.  Sau khi qu v? ? t?m vi hoa sen ho?c t?m b?n, dng kh?n s?ch th?m nh? nhng vo v?t th??ng. Khngch xt v?t th??ng.  Khng th?c hi?n b?t c? hnh ??ng no lm qu v? ?? m? hi nhi?u cho ??n khi keo dn da ? t? bong ra.  Khng bi thu?c n??c, thu?c kem ho?c thu?c m? vo v?t th??ng khi ?ang c keo dn da. S? d?ng cc thu?c ? c th? lm l?ng l?p phim tr??c khi v?t th??ng li?n l?i.  N?u qu v? ???c cho dng mi?ng b?ng (b?ng), qu v? c?n thay b?ng t nh?t m?t l?n m?i ngy ho?c theo ch? d?n c?a chuyn gia ch?m [place] s?c kh?e. Qu v? c?ng c?n thay b?ng khi b?ng b? b?n ho?c ??t.  N?u c b?ng trn v?t th??ng, hy c?n th?n ?? khng dn b?ng tr?c ti?p vo keo dn da. Lm nh? v?y c th? lm keo bong ra tr??c khi v?t th??ng li?n l?i.  Khng ch?c vo keo dn. Keo dn da th??ng duy tr ? v? tr dn trong 5-10 ngy, sau ? bong ra kh?i da. H??ng d?n chung  Ch? s? d?ng thu?c khng c?n k ??n v thu?c c?n k ??n theo ch? d?n c?a chuyn gia ch?m [place] s?c kh?e.  N?u qu v? ???c k thu?c khng sinh ho?c thu?c m?, hy dng ho?c bi thu?c theo ch? d?n c?a bc s?Maggie Schwalbe d?ng s? d?ng thu?c ngay c? khi tnh tr?ng c?a qu v? ???c c?i thi?n.  ?? trnh Roselind Messier s?o, hy ch?c ch?n vi?c ph? ln v?t th??ng c?a qu v? b?ng kem ch?ng n?ng b?t k? khi no qu v? ? ngoi tr?i sau khi tho ch? khu, sau khi tho b?ng dnh, ho?c khi keo dn v?n duy tr ? v? tr dn v v?t th??ng li?n l?i. Hy ch?c ch?n mang kem ch?ng n?ng t nh?t 30 SPF.  Khng gi ho?c ch?c v?t th??ng.  Tun th? t?t c?  cc cu?c h?n khm l?i theo ch? d?n c?a chuyn gia ch?m [place] s?c kh?e. ?i?u ny c vai tr quan tr?ng.  Ki?m tra v?t th??ng m?i ngy xem c d?u hi?u nhi?m trng hay khng. Theo di xem c: ? T?y ??, s?ng n?, ho?c ?au. ? D?ch, mu, ho?c m?.  Nng (nng cao) vng b? th??ng ln cao h?n tim khi qu v? ng?i ho?c n?m, n?u c th?. ?I KHM N?U:  Qu v? ???c tim phng u?n vn v qu v? b? s?ng, ?au nhi?u, t?y ??, ho?c ch?y mu ? ch? tim.  Qu v? b? s?t.  V?t th??ng ? kn b? h mi?ng.  Qu v? th?y mi hi thot ra t? v?t th??ng ho?c b?ng.  Qu v? th?y c g ? l? ra kh?i v?t th??ng, ch?ng h?n nh? g? ho?c th?y tinh.  C?n ?au c?a qu v? khng ki?m sot ???c b?ng thu?c.  Qu v? b? t?y ??, s?ng ho?c ?au t?ng ln t?i v? tr v?t th??ng.  Qu v? c ch?t d?ch, mu ho?c m? ch?y ? v?t th??ng.  Qu v? th?y c thay ??i mu da ch? g?n v?t th??ng.  Qu v? c?n thay b?ng th??ng xuyn do ch?t d?ch, mu, ho?c m? ch?y ? v?t th??ng.  Qu v? b? m?t ch? pht ban m?i.  Qu v? b? t b xung quanh v?t th??ng.  NGAY L?P T?C ?I KHM N?U:  Qu v? b? s?ng nghim tr?ng xung quanh v?t th??ng.  C?n ?au c?a qu v? b?t ng? t?ng ln v ? m?c nghim tr?ng.  Qu v? b? m?t kh?i u g?n v?t th??ng gy ?au ??n ho?c trn da ? b?t k? ch? no trn c? th?.  Qu v? c nh?ng v?t s?c ?? ch?y t? v?t th??ng.  V?t th??ng ? bn tay ho?c bn chn v qu v? khng th? c? ??ng ngn tay ho?c ngn chn nh? bnh th??ng.  V?t th??ng ? bn tay ho?c bn chn v qu v? th?y ngn tay ho?c ngn chn trng nh?t nh?t ho?c h?i xanh.  Thng tin ny khng nh?m m?c ?ch thay th? cho l?i khuyn m chuyn gia ch?m [place] s?c kh?e ni v?i qu v?. Hy b?o ??m qu v? ph?i th?o lu?n b?t k? v?n ?? g m qu v? c v?i chuyn gia ch?m [place] s?c kh?e c?a qu v?. Document Released: 08/22/2005 Document Revised: 01/06/2015 Document Reviewed: 08/18/2014 Elsevier Interactive Patient Education  2017 [place].   IF you received an x-ray today, you will receive  an invoice from [facility]. Please contact [facility] at [phone] with questions or concerns regarding your invoice.   IF you received labwork today, you will receive an invoice from [facility]. Please contact [facility] at [phone] with questions or concerns regarding your invoice.   Our billing staff will not be able to assist you with questions regarding bills from these companies.  You will be contacted with the lab results as soon as they are available. The fastest way to get your results is to activate your My Chart account. Instructions are located on the last page of this paperwork. If you have not heard from [place] regarding the results in 2 weeks, please contact this office.

## 2017-08-19 ENCOUNTER — Ambulatory Visit (INDEPENDENT_AMBULATORY_CARE_PROVIDER_SITE_OTHER): Payer: Medicare HMO | Admitting: Urgent Care

## 2017-08-19 ENCOUNTER — Encounter: Payer: Self-pay | Admitting: Urgent Care

## 2017-08-19 ENCOUNTER — Other Ambulatory Visit: Payer: Self-pay

## 2017-08-19 VITALS — HR 74 | Temp 98.7°F | Resp 16 | Ht 67.72 in | Wt 186.0 lb

## 2017-08-19 DIAGNOSIS — R748 Abnormal levels of other serum enzymes: Secondary | ICD-10-CM | POA: Diagnosis not present

## 2017-08-19 DIAGNOSIS — M545 Low back pain, unspecified: Secondary | ICD-10-CM

## 2017-08-19 DIAGNOSIS — S39012A Strain of muscle, fascia and tendon of lower back, initial encounter: Secondary | ICD-10-CM

## 2017-08-19 DIAGNOSIS — M5137 Other intervertebral disc degeneration, lumbosacral region: Secondary | ICD-10-CM

## 2017-08-19 LAB — POCT URINALYSIS DIP (MANUAL ENTRY)
Bilirubin, UA: NEGATIVE
GLUCOSE UA: NEGATIVE mg/dL
Ketones, POC UA: NEGATIVE mg/dL
Leukocytes, UA: NEGATIVE
NITRITE UA: NEGATIVE
PROTEIN UA: NEGATIVE mg/dL
SPEC GRAV UA: 1.015 (ref 1.010–1.025)
UROBILINOGEN UA: 0.2 U/dL
pH, UA: 5.5 (ref 5.0–8.0)

## 2017-08-19 MED ORDER — CYCLOBENZAPRINE HCL 5 MG PO TABS: 5.0000 mg | ORAL_TABLET | Freq: Every day | ORAL | 1 refills | Status: DC

## 2017-08-19 MED ORDER — MELOXICAM 7.5 MG PO TABS: 7.5000 mg | ORAL_TABLET | Freq: Every day | ORAL | 0 refills | Status: DC

## 2017-08-19 NOTE — Patient Instructions (Addendum)
Degenerative Disk Disease Degenerative disk disease is a condition caused by the changes that occur in spinal disks as you grow older. Spinal disks are soft and compressible disks located between the bones of your spine (vertebrae). These disks act like shock absorbers. Degenerative disk disease can affect the whole spine. However, the neck and lower back are most commonly affected. Many changes can occur in the spinal disks with aging, such as:  The spinal disks may dry and shrink.  Small tears may occur in the tough, outer covering of the disk (annulus).  The disk space may become smaller due to loss of water.  Abnormal growths in the bone (spurs) may occur. This can put pressure on the nerve roots exiting the spinal canal, causing pain.  The spinal canal may become narrowed.  What increases the risk?  Being overweight.  Having a family history of degenerative disk disease.  Smoking.  There is increased risk if you are doing heavy lifting or have a sudden injury. What are the signs or symptoms? Symptoms vary from person to person and may include:  Pain that varies in intensity. Some people have no pain, while others have severe pain. The location of the pain depends on the part of your backbone that is affected. ? You will have neck or arm pain if a disk in the neck area is affected. ? You will have pain in your back, buttocks, or legs if a disk in the lower back is affected.  Pain that becomes worse while bending, reaching up, or with twisting movements.  Pain that may start gradually and then get worse as time passes. It may also start after a major or minor injury.  Numbness or tingling in the arms or legs.  How is this diagnosed? Your health care provider will ask you about your symptoms and about activities or habits that may cause the pain. He or she may also ask about any injuries, diseases, or treatments you have had. Your health care provider will examine you to check  for the range of movement that is possible in the affected area, to check for strength in your extremities, and to check for sensation in the areas of the arms and legs supplied by different nerve roots. You may also have:  An X-ray of the spine.  Other imaging tests, such as MRI.  How is this treated? Your health care provider will advise you on the best plan for treatment. Treatment may include:  Medicines.  Rehabilitation exercises.  Follow these instructions at home:  Follow proper lifting and walking techniques as advised by your health care provider.  Maintain good posture.  Exercise regularly as advised by your health care provider.  Perform relaxation exercises.  Change your sitting, standing, and sleeping habits as advised by your health care provider.  Change positions frequently.  Lose weight or maintain a healthy weight as advised by your health care provider.  Do not use any tobacco products, including cigarettes, chewing tobacco, or electronic cigarettes. If you need help quitting, ask your health care provider.  Wear supportive footwear.  Take medicines only as directed by your health care provider. Contact a health care provider if:  Your pain does not go away within 1-4 weeks.  You have significant appetite or weight loss. Get help right away if:  Your pain is severe.  You notice weakness in your arms, hands, or legs.  You begin to lose control of your bladder or bowel movements.  You have   fevers or night sweats. This information is not intended to replace advice given to you by your health care provider. Make sure you discuss any questions you have with your health care provider. Document Released: 06/19/2007 Document Revised: 01/28/2016 Document Reviewed: 12/24/2013 [employer]vier Interactive Patient Education  2018 [employer].     IF you received an x-ray today, you will receive an invoice from [facility]. Please contact [facility]  Radiology at [phone] with questions or concerns regarding your invoice.   IF you received labwork today, you will receive an invoice from [facility]. Please contact [facility] at [phone] with questions or concerns regarding your invoice.   Our billing staff will not be able to assist you with questions regarding bills from these companies.  You will be contacted with the lab results as soon as they are available. The fastest way to get your results is to activate your My Chart account. Instructions are located on the last page of this paperwork. If you have not heard from [place] regarding the results in 2 weeks, please contact this office.
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
## 2017-08-20 LAB — COMPREHENSIVE METABOLIC PANEL
ALK PHOS: 90 IU/L (ref 39–117)
ALT: 40 IU/L (ref 0–44)
AST: 27 IU/L (ref 0–40)
Albumin/Globulin Ratio: 1.5 (ref 1.2–2.2)
Albumin: 4.4 g/dL (ref 3.6–4.8)
BUN/Creatinine Ratio: 18 (ref 10–24)
BUN: 16 mg/dL (ref 8–27)
Bilirubin Total: 0.8 mg/dL (ref 0.0–1.2)
CALCIUM: 9 mg/dL (ref 8.6–10.2)
CO2: 25 mmol/L (ref 20–29)
CREATININE: 0.89 mg/dL (ref 0.76–1.27)
Chloride: 102 mmol/L (ref 96–106)
GFR calc Af Amer: 104 mL/min/{1.73_m2} (ref >59)
GFR, EST NON AFRICAN AMERICAN: 90 mL/min/{1.73_m2} (ref >59)
GLUCOSE: 94 mg/dL (ref 65–99)
Globulin, Total: 3 g/dL (ref 1.5–4.5)
Potassium: 4.3 mmol/L (ref 3.5–5.2)
Sodium: 139 mmol/L (ref 134–144)
Total Protein: 7.4 g/dL (ref 6.0–8.5)

## 2017-08-21 ENCOUNTER — Encounter: Payer: Self-pay | Admitting: Urgent Care

## 2017-08-21 ENCOUNTER — Telehealth: Payer: Self-pay

## 2017-08-21 NOTE — Telephone Encounter (Signed)
Started PA process for Cyclobenzaprine.  Key is [id number].  Should reach decision within 5 business days.

## 2017-08-22 NOTE — Telephone Encounter (Signed)
Received fax stating that patient is approved from 09/03/16-12/31-19. Will notify patient.

## 2017-09-15 ENCOUNTER — Other Ambulatory Visit: Payer: Self-pay | Admitting: Urgent Care

## 2017-09-15 NOTE — Telephone Encounter (Signed)
He was for a 1 wk follow up that I don't see documented in the notes.

## 2017-11-30 ENCOUNTER — Other Ambulatory Visit: Payer: Self-pay

## 2017-11-30 ENCOUNTER — Ambulatory Visit (INDEPENDENT_AMBULATORY_CARE_PROVIDER_SITE_OTHER): Payer: Medicare HMO | Admitting: Urgent Care

## 2017-11-30 ENCOUNTER — Encounter: Payer: Self-pay | Admitting: Urgent Care

## 2017-11-30 ENCOUNTER — Ambulatory Visit (INDEPENDENT_AMBULATORY_CARE_PROVIDER_SITE_OTHER): Payer: Medicare HMO

## 2017-11-30 VITALS — BP 140/60 | HR 90 | Temp 97.8°F | Ht 67.72 in | Wt 190.0 lb

## 2017-11-30 DIAGNOSIS — Z1211 Encounter for screening for malignant neoplasm of colon: Secondary | ICD-10-CM

## 2017-11-30 DIAGNOSIS — R140 Abdominal distension (gaseous): Secondary | ICD-10-CM

## 2017-11-30 DIAGNOSIS — R1084 Generalized abdominal pain: Secondary | ICD-10-CM

## 2017-11-30 DIAGNOSIS — R609 Edema, unspecified: Secondary | ICD-10-CM

## 2017-11-30 DIAGNOSIS — R195 Other fecal abnormalities: Secondary | ICD-10-CM | POA: Diagnosis not present

## 2017-11-30 DIAGNOSIS — R198 Other specified symptoms and signs involving the digestive system and abdomen: Secondary | ICD-10-CM

## 2017-11-30 DIAGNOSIS — R109 Unspecified abdominal pain: Secondary | ICD-10-CM | POA: Diagnosis not present

## 2017-11-30 DIAGNOSIS — R600 Localized edema: Secondary | ICD-10-CM

## 2017-11-30 MED ORDER — DOCUSATE SODIUM 50 MG PO CAPS: 50.0000 mg | ORAL_CAPSULE | Freq: Two times a day (BID) | ORAL | 0 refills | Status: DC

## 2017-11-30 NOTE — Patient Instructions (Addendum)
For your leg swelling, use compression stockings.    Please start Colace (docusate) stool softener, twice a day for at least 1 week. If stools become loose, cut down to once a day for another week. If stools remain loose, cut back to 1 pill every other day for a third week. You can stop docusate thereafter and resume as needed for constipation.  To help reduce constipation and promote bowel health: 1. Drink at least 64 ounces of water each day. That's 2 liters (1 gallon) of water. 2. Eat plenty of fiber (fruits, vegetables, whole grains, legumes) 3. Be physically active or exercise including walking, jogging, swimming, yoga, etc. 4. For active constipation use a stool softener (docusate) or an osmotic laxative (like Miralax) each day, or as needed.     To bn, Ng??i l?n Constipation, Adult To bn l khi m?t ng??i ?i ??i ti?n trong m?t tu?n t h?n so v?i bnh th??ng, ??i ti?n kh kh?n, ho?c ??i ti?n ra phn kh, c?ng, ho?c to h?n bnh th??ng. To bn c th? do m?t tnh tr?ng bn trong gy ra. N c th? tr? ln tr?m tr?ng h?n theo ?? tu?i n?u m?t ng??i dng cc lo?i thu?c nh?t ??nh v khng u?ng ?? n??c. Tun th? nh?ng h??ng d?n ny ? nh: ?n v u?ng   ?n th?c ?n c nhi?u ch?t x? nh? tri cy t??i v rau, ng? c?c nguyn h?t v cc lo?i ??u.  H?n ch? th?c ?n c nhi?u ch?t bo, t ch?t x?, ho?c ch? bi?n s?n qu m?c, ch?ng h?n khoai ty chin, bnh hamburger, bnh quy, k?o v soda.  U?ng ?? n??c ?? gi? cho n??c ti?u trong ho?c c mu vng nh?t. H??ng d?n chung  T?p th? d?c th??ng xuyn theo ch? d?n c?a chuyn gia ch?m [place] s?c kh?e.  ?i v? sinh ngay khi qu v? c nhu c?u. Khng nh?n ?i ??i ti?n.  Ch? s? d?ng thu?c khng k ??n v thu?c k ??n theo ch? d?n c?a chuyn gia ch?m [place] s?c kh?e. Cc thu?c ny bao g?m b?t k? th?c ph?m ch?c n?ng c ch?t x? no.  Th?c hnh cc bi t?p luy?n l?i c? ?y ch?u, ch?ng h?n nh? th? su trong lc th? gin b?ng d??i v th? gin ph?n ?y ch?u trong khi ??i  ti?n.  Theo di tnh tr?ng c?a qu v? ?? pht hi?n b?t k? thay ??i no.  Tun th? t?t c? cc l?n khm theo di theo ch? d?n c?a chuyn gia ch?m [place] s?c kh?e. ?i?u ny c vai tr quan tr?ng. Hy lin l?c v?i chuyn gia ch?m [place] s?c kh?e n?u:  Qu v? b? ?au tr?m tr?ng h?n.  Qu v? b? s?t.  Qu v? khng ?i ??i ti?n sau 4 ngy.  Qu v? nn.  Qu v? khng ?i.  Qu v? b? s?t cn.  Qu v? b? ch?y mu ? h?u mn.  Phn c?a qu v? m?ng, trng nh? bt ch. Yu c?u tr? gip ngay l?p t?c n?u:  Qu v? b? s?t v cc tri?u ch?ng c?a qu v? ??t nhin tr?m tr?ng h?n.  Qu v? sn phn ho?c c mu trong phn.  B?ng c?a qu v? b? ch??ng ln.  Qu v? b? ?au r?t nhi?u ? b?ng.  Qu v? c?m th?y chng m?t ho?c ng?t x?u. Thng tin ny khng nh?m m?c ?ch thay th? cho l?i khuyn m chuyn gia ch?m [place] s?c kh?e ni v?i qu v?. Hy b?o ??m qu v? ph?i th?o  lu?n b?t k? v?n ?? g m qu v? c v?i chuyn gia ch?m [place] s?c kh?e c?a qu v?. Document Released: 12/07/2010 Document Revised: 12/05/2016 Document Reviewed: 02/10/2016 Elsevier Interactive Patient Education  2018 [place] soi ??i trng, Ng??i l?n Colonoscopy, Adult N?i soi ??i trng l th?m khm ?? ki?m tra ton b? ru?t gi. Trong qu trnh th?m khm, m?t ?ng ???c bi tr?n, c th? u?n cong ???c ??a vo trong h?u mn v sau ? vo tr?c trng, ??i trng v cc ph?n khc c?a ru?t gi. N?i soi ??i trng th??ng ???c th?c hi?n nh? m?t ph?n c?a khm sng l?c ??i-tr?c k?t trng thng th??ng ho?c ?? ?ng ph v?i m?t s? tri?u ch?ng nh?t ??nh, ch?ng h?n nh? b?nh thi?u mu, tiu ch?y dai d?ng, ?au b?ng v mu trong phn. Th?m khm ny c th? gip sng l?c v ch?n ?on cc v?n ?? b?nh l, bao g?m:  Kh?i u.  Polip.  Vim.  Cc vng ch?y mu.  Hy cho chuyn gia ch?m [place] s?c kh?e bi?t v?:  B?t k? v?n ?? d? ?ng no m qu v? c.  T?t c? cc lo?i thu?c m qu v? ?ang s? d?ng, bao g?m c? vitamin, th?o d??c, thu?c nh? m?t, thu?c d?ng kem,  thu?c khng k ??n.  B?t k? v?n ?? g m qu v? ho?c cc thnh vin trong gia ?nh ? g?p ph?i v?i thu?c gy m.  B?t k? r?i lo?n v? mu no m qu v? c.  B?t k? ph?u thu?t no qu v? ? ???c lm.  B?t k? tnh tr?ng b?nh l no c?a qu v?.  B?t k? v?n ?? no m qu v? ? b? trong khi ??i ti?n. Cc nguy c? l g? Ni chung, ?y l m?t th? thu?t an ton. Tuy nhin, cc v?n ?? c th? x?y ra, bao g?m:  Ch?y mu.  V?t rch trong ru?t.  Ph?n ?ng v?i thu?c ???c cho s? d?ng trong lc th?m khm.  Nhi?m trng (hi?m g?p).  ?i?u g x?y ra tr??c khi lm th? thu?t? Nh?ng h?n ch? v? ?n v u?ng Tun th? ch? d?n c?a chuyn gia ch?m [place] s?c kh?e v? ?n v u?ng, c th? bao g?m:  M?t vi ngy tr??c khi ti?n hnh th? thu?t - tun theo ch? ?? ?n t ch?t x?. Trnh ?n qu? h?ch, cc lo?i h?t, tri cy s?y, tri cy s?ng v rau.  1-3 ngy tr??c ngy ti?n hnh th? thu?t - tun theo ch? ?? ?n ?? l?ng trong. Ch? u?ng cc ?? l?ng trong, ch?ng h?n nh? canh ho?c n??c canh th?t trong, tr ho?c c ph ?en, n??c p trong, n??c ng?t ho?c n??c u?ng th? thao trong, mn trng mi?ng ch?a gelatin v kem que. Trnh u?ng cc ch?t l?ng c ch?a ph?m mu ?? ho?c tm.  Vo ngy ti?n hnh th? thu?t - khng ?n hay u?ng b?t k? th? g trong vng 2 gi? tr??c khi ti?n hnh th? thu?t, ho?c trong kho?ng th?i gian m chuyn gia ch?m [place] s?c kh?e c?a qu v? ch? d?n.  Lm s?ch ru?t N?u qu v? ? ???c k ??n m?t lo?i thu?c x? qua ???ng u?ng ?? lm s?ch ??i trng:  Hy s?? du?ng theo ch? d?n c?a chuyn gia ch?m [place] s?c kh?e c?a qu v?. B?t ??u vo ngy tr??c khi lm th? thu?t, qu v? s? c?n u?ng m?t l??ng l?n d?ch l?ng pha thu?c. Ch?t l?ng ny s? lm qu v? ??  i ti?n phn l?ng nhi?u l?n cho ??n khi phn g?n nh? trong ho?c c mu xanh l cy nh?t.  N?u da ho?c h?u mn c?a qu v? b? kch ?ng do tiu ch?y, qu v? c th? s? d?ng nh?ng th? sau ?? lm gi?m kch ?ng: ? Kh?n lau t?m thu?c, ch?ng h?n nh? kh?n lau ??t c?a ng??i l?n c tinh ch?t l  h?i v vitamin E. ? S?n ph?m lm d?u da nh? [place].  N?u qu v? b? nn trong khi u?ng thu?c x?, hy ngh? ng?i trong t?i ?a 60 pht v sau ? b?t ??u l?i vi?c lm s?ch ru?t. N?u qu v? ti?p t?c nn v khng th? u?ng thu?c x? m khng b? nn, hy g?i cho chuyn gia ch?m [place] s?c kh?e c?a qu v?.  H??ng d?n chung  Hy h?i chuyn gia ch?m [place] s?c kh?e v? vi?c thay ??i ho?c d?ng cc lo?i thu?c dng th??ng xuyn c?a qu v?. ?i?u ny ??c bi?t quan tr?ng n?u qu v? ?ang dng thu?c tr? ti?u ???ng ho?c thu?c lm long mu.  C k? ho?ch nh? ai ? ??a quy? vi? t? b?nh vi?n ho?c t? phng khm v? nh. ?i?u g x?y ra trong qu trnh th?c hi?n th? thu?t?  Qu v? c th? ???c ??t m?t ???ng truy?n t?nh m?ch (IV) vo m?t trong cc t?nh m?ch.  Qu v? s? ???c cho dng thu?c ?? gip th? gin (thu?c an th?n).  ?? gi?m nguy c? nhi?m trng: ? ??i ng? nhn vin y t? s? r?a ho?c st trng tay c?a h?. ? Vng h?u mn c?a qu v? s? ???c r?a b?ng x phng.  Qu v? s? ???c yu c?u n?m nghing, hai ??u g?i g?p l?i.  Chuyn gia ch?m [place] s?c kh?e c?a qu v? s? bi tr?n m?t ?ng di, m?ng, m?m. ?ng s? ???c g?n camera v ?n ? ??u.  ?ng s? ???c ??a vo h?u mn c?a qu v?.  ?ng s? ???c nh? nhng ??a qua tr?c trng v ??i trng c?a qu v?.  Khng kh s? ???c b?m vo ??i trng c?a qu v? ?? gi? cho ??i trng m? r?ng. Qu v? c th? c?m th?y m?t cht p l?c ho?c co th?t.  Camera s? ???c s? d?ng ?? ch?p ?nh trong qu trnh ti?n hnh th? thu?t.  M?t m?u m nh? co? th? ????c l?y t?? c? th? c?a qu v? ?? ki?m tra d???i ki?nh hi?n vi (sinh thi?t). N?u pht hi?n th?y b?t k? v?n ?? ti?m tng no, m ny s? ???c g?i ??n phng th nghi?m ?? xt nghi?m.  N?u pht hi?n th?y cc polip nh?, chuyn gia ch?m [place] s?c kh?e c?a qu v? c th? l?y cc polip ? v mang ?i ki?m tra ?? xem c t? bo ung th? khng.  ?ng ??a vo h?u mn c?a qu v? s? ???c t? t? rt ra. Th? thu?t ny c th? khc nhau gi?a cc chuyn gia ch?m [place] s?c kh?e v  cc b?nh vi?n. ?i?u g x?y ra sau khi lm th? thu?t?  Huy?t p, nh?p tim, nh?p th? v n?ng ?? oxi trong mu c?a qu v? s? ???c theo di cho ??n khi thu?c qu v? ? dng h?t tc d?ng.  Khng li xe trong vng 24 gi? sau khi th?m khm.  Qu v? c th? c m?t l??ng mu nh? trong phn.  Qu v? c th? trung ti?n v b? co th?t ho?c ch??ng b?ng nh? do khng kh ? ???  c s? d?ng ?? lm ph?ng ??i trng c?a qu v? trong lc th?m khm.  Qu v? ???c ty  l?y k?t qu? th? thu?t c?a mnh. Hy h?i chuyn gia ch?m [place] s?c kh?e ho?c khoa th?c hi?n thu? thu?t ?? bi?t khi no c k?t qu? c?a qu v?. Thng tin ny khng nh?m m?c ?ch thay th? cho l?i khuyn m chuyn gia ch?m [place] s?c kh?e ni v?i qu v?. Hy b?o ??m qu v? ph?i th?o lu?n b?t k? v?n ?? g m qu v? c v?i chuyn gia ch?m [place] s?c kh?e c?a qu v?. Document Released: 06/01/2005 Document Revised: 08/04/2016 Document Reviewed: 11/03/2015 Elsevier Interactive Patient Education  2018 [employer].     IF you received an x-ray today, you will receive an invoice from [facility]. Please contact [facility] at [phone] with questions or concerns regarding your invoice.   IF you received labwork today, you will receive an invoice from [place]. Please contact [facility] at [phone] with questions or concerns regarding your invoice.   Our billing staff will not be able to assist you with questions regarding bills from these companies.  You will be contacted with the lab results as soon as they are available. The fastest way to get your results is to activate your My Chart account. Instructions are located on the last page of this paperwork. If you have not heard from [place] regarding the results in 2 weeks, please contact this office.
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
## 2017-12-01 LAB — COMPREHENSIVE METABOLIC PANEL
ALBUMIN: 4.2 g/dL (ref 3.6–4.8)
ALK PHOS: 67 IU/L (ref 39–117)
ALT: 29 IU/L (ref 0–44)
AST: 23 IU/L (ref 0–40)
Albumin/Globulin Ratio: 1.3 (ref 1.2–2.2)
BILIRUBIN TOTAL: 0.8 mg/dL (ref 0.0–1.2)
BUN / CREAT RATIO: 16 (ref 10–24)
BUN: 18 mg/dL (ref 8–27)
CHLORIDE: 105 mmol/L (ref 96–106)
CO2: 22 mmol/L (ref 20–29)
Calcium: 9 mg/dL (ref 8.6–10.2)
Creatinine, Ser: 1.11 mg/dL (ref 0.76–1.27)
GFR calc Af Amer: 80 mL/min/{1.73_m2} (ref >59)
GFR calc non Af Amer: 69 mL/min/{1.73_m2} (ref >59)
GLUCOSE: 81 mg/dL (ref 65–99)
Globulin, Total: 3.3 g/dL (ref 1.5–4.5)
POTASSIUM: 4.1 mmol/L (ref 3.5–5.2)
Sodium: 142 mmol/L (ref 134–144)
Total Protein: 7.5 g/dL (ref 6.0–8.5)

## 2017-12-01 LAB — HELICOBACTER PYLORI  ANTIBODY, IGM

## 2017-12-05 ENCOUNTER — Encounter: Payer: Self-pay | Admitting: *Deleted

## 2017-12-12 DIAGNOSIS — R351 Nocturia: Secondary | ICD-10-CM | POA: Diagnosis not present

## 2017-12-12 DIAGNOSIS — N401 Enlarged prostate with lower urinary tract symptoms: Secondary | ICD-10-CM | POA: Diagnosis not present

## 2017-12-19 ENCOUNTER — Encounter: Payer: Self-pay | Admitting: Physician Assistant

## 2018-01-02 DIAGNOSIS — R338 Other retention of urine: Secondary | ICD-10-CM | POA: Diagnosis not present

## 2018-01-02 DIAGNOSIS — N401 Enlarged prostate with lower urinary tract symptoms: Secondary | ICD-10-CM | POA: Diagnosis not present

## 2018-01-02 DIAGNOSIS — R972 Elevated prostate specific antigen [PSA]: Secondary | ICD-10-CM | POA: Diagnosis not present

## 2018-01-08 ENCOUNTER — Encounter: Payer: Self-pay | Admitting: Physician Assistant

## 2018-01-08 ENCOUNTER — Other Ambulatory Visit (INDEPENDENT_AMBULATORY_CARE_PROVIDER_SITE_OTHER): Payer: Medicare HMO

## 2018-01-08 ENCOUNTER — Ambulatory Visit: Payer: Medicare HMO | Admitting: Physician Assistant

## 2018-01-08 VITALS — BP 140/78 | HR 66 | Ht 67.7 in | Wt 188.2 lb

## 2018-01-08 DIAGNOSIS — K5900 Constipation, unspecified: Secondary | ICD-10-CM | POA: Diagnosis not present

## 2018-01-08 DIAGNOSIS — R1031 Right lower quadrant pain: Secondary | ICD-10-CM

## 2018-01-08 DIAGNOSIS — R140 Abdominal distension (gaseous): Secondary | ICD-10-CM | POA: Diagnosis not present

## 2018-01-08 DIAGNOSIS — R1013 Epigastric pain: Secondary | ICD-10-CM

## 2018-01-08 LAB — CBC WITH DIFFERENTIAL/PLATELET
Basophils Absolute: 0 10*3/uL (ref 0.0–0.1)
Basophils Relative: 0.3 % (ref 0.0–3.0)
Eosinophils Absolute: 0.1 10*3/uL (ref 0.0–0.7)
Eosinophils Relative: 1.5 % (ref 0.0–5.0)
HCT: 38.3 % — ABNORMAL LOW (ref 39.0–52.0)
Hemoglobin: 12.7 g/dL — ABNORMAL LOW (ref 13.0–17.0)
LYMPHS ABS: 1.3 10*3/uL (ref 0.7–4.0)
Lymphocytes Relative: 21.1 % (ref 12.0–46.0)
MCHC: 33.2 g/dL (ref 30.0–36.0)
MONOS PCT: 5.1 % (ref 3.0–12.0)
Monocytes Absolute: 0.3 10*3/uL (ref 0.1–1.0)
NEUTROS ABS: 4.3 10*3/uL (ref 1.4–7.7)
NEUTROS PCT: 72 % (ref 43.0–77.0)
Platelets: 121 10*3/uL — ABNORMAL LOW (ref 150.0–400.0)
RBC: 6.03 Mil/uL — ABNORMAL HIGH (ref 4.22–5.81)
RDW: 16.4 % — AB (ref 11.5–15.5)
WBC: 5.9 10*3/uL (ref 4.0–10.5)

## 2018-01-08 LAB — BASIC METABOLIC PANEL
BUN: 16 mg/dL (ref 6–23)
CALCIUM: 9.1 mg/dL (ref 8.4–10.5)
CO2: 28 meq/L (ref 19–32)
CREATININE: 0.96 mg/dL (ref 0.40–1.50)
Chloride: 105 mEq/L (ref 96–112)
GFR: 83.2 mL/min (ref >60.00)
GLUCOSE: 101 mg/dL — AB (ref 70–99)
Potassium: 4.5 mEq/L (ref 3.5–5.1)
Sodium: 140 mEq/L (ref 135–145)

## 2018-01-08 LAB — SEDIMENTATION RATE: Sed Rate: 27 mm/hr — ABNORMAL HIGH (ref 0–20)

## 2018-01-08 NOTE — Patient Instructions (Addendum)
Please go to the basement level to have your labs drawn.  Take Miralax 17 grams in 8 oz of water daily.    You have been scheduled for a CT scan of the abdomen and pelvis at [facility] ([address]---this is in the same building as [facility]).   You are scheduled on Monday 5-13 at 9:30 am. You should arrive at 9:15 am to your appointment time for registration. Please follow the written instructions below on the day of your exam:  WARNING: IF YOU ARE ALLERGIC TO IODINE/X-RAY DYE, PLEASE NOTIFY RADIOLOGY IMMEDIATELY AT [phone]! YOU WILL BE GIVEN A 13 HOUR PREMEDICATION PREP.  1) Do not eat  anything after 5:30 am (4 hours prior to your test) 2) You have been given 2 bottles of oral contrast to drink. The solution may taste better if refrigerated, but do NOT add ice or any other liquid to this solution. Shake well before drinking.    Drink 1 bottle of contrast @ 7:30 am (2 hours prior to your exam)  Drink 1 bottle of contrast @ 8:30 am (1 hour prior to your exam)  You may take any medications as prescribed with a small amount of water except for the following: Metformin, Glucophage, Glucovance, Avandamet, Riomet, Fortamet, Actoplus Met, Janumet, Glumetza or Metaglip. The above medications must be held the day of the exam AND 48 hours after the exam.  The purpose of you drinking the oral contrast is to aid in the visualization of your intestinal tract. The contrast solution may cause some diarrhea. Before your exam is started, you will be given a small amount of fluid to drink. Depending on your individual set of symptoms, you may also receive anVui lng ?i ??n t?ng h?m ?? phng th nghi?m c?a b?n ???c rt ra. U?ng Miralax 17 gram trong 8 oz n??c m?i ngy. B?n ? ???c ln l?ch ch?p CT b?ng v x??ng ch?u t?i [facility] ([address] --- ?y l cng ta nh v?i [facility]). B?n ???c ln k? ho?ch vo Th? Hai 5-13 lc 9:30 sng. B?n nn ??n lc 9:15  sng ??n gi? h?n ?? ??ng k. Vui lng lm theo cc h??ng d?n b?ng v?n b?n d??i ?y vo ngy thi c?a b?n: C?NH BO: N?U B?N H?P D?N ??N IODINE / X-RAY D? DNG, XIN VUI LNG THNG BO NGAY L?P T?C NGAY L?P T?C T?I [phone]! B?N SIV ???C G?I M?T CHU?N B? 13 GI?Marland Kitchen 1) Khng ?n b?t c? th? g sau 5:30 sng (4 gi? tr??c khi ki?m tra c?a b?n) 2) B?n ? ???c cho u?ng 2 chai t??ng ph?n ?? u?ng. Dung d?ch c th? ngon h?n n?u ?? trong t? l?nh, nh?ng [facility] thm ? ho?c b?t k? ch?t l?ng no khc vo dung d?ch ny. L?c ??u tr??c khi u?ng. U?ng 1 chai t??ng ph?n @ 7:30 sng (2 gi? tr??c khi thi) U?ng 1 chai t??ng ph?n @ 8:30 sng (1 gi? tr??c khi thi) B?n c th? dng b?t k? lo?i thu?c no theo quy ??nh v?i m?t l??ng n??c nh? ngo?i tr? cc lo?i sau: Metformin, Glucophage, Glucovance, Avandamet, Riomet, Fortamet, Actoplus Met, Janumet, Glumetza ho?c Metaglip. Cc lo?i thu?c trn ph?i ???c t? ch?c vo ngy thi V 48 gi? sau khi thi. M?c ?ch c?a vi?c b?n u?ng thu?c c?n quang ???ng u?ng l ?? h? tr? hnh dung ???ng ru?t c?a b?n. Cc gi?i php t??ng ph?n c th? gy ra m?t s? tiu ch?y. Tr??c khi bi  ki?m tra c?a b?n ???c b?t ??u, b?n s? ???c cung c?p m?t l??ng nh? ch?t l?ng ?? u?ng. Ty thu?c vo t?ng tri?u ch?ng ring l? c?a b?n, b?n c?ng c th? ???c tim t?nh m?ch t??ng ph?n / thu?c nhu?m. Ln k? ho?ch c m?t t?i [place] trong [place] pht ho?c lu, ty thu?c vo lo?i bi ki?m tra b?n ?ang th?c hi?n. N?u b?n c b?t k? cu h?i no lin quan ??n bi ki?m tra c?a b?n ho?c n?u b?n c?n ln l?ch l?i, b?n c th? g?i cho b? ph?n CT theo s? [phone] trong kho?ng th?i gian t? 8:00 sng ??n 5:00 chi?u, Th? Hai-Th? Su.   intravenous injection of x-ray contrast/dye. Plan on being at [facility] for 30 minutes or long, depending on the type of exam you are having performed.    If you have any questions regarding your exam or if you need to reschedule, you may call the CT department at [phone] between the hours of 8:00  am and 5:00 pm, Monday-Friday.  ________________________________________________________________________

## 2018-01-08 NOTE — Progress Notes (Signed)
Subjective:    Patient ID: Bobby [name], male    DOB: 10/12/1950, 67 y.o.   MRN: [id number]  HPI Bobby [name] is a pleasant 67 year old [place] male, new to GI today, referred by Bobby Eagles PA-C for evaluation of abdominal pain bloating and gas. Patient has not had any prior GI evaluation.  He is status post [place] 90[place]  He also has history of osteoarthritis and prostatitis. Patient is accompanied by his wife today, his understanding of English is fairly good but there is definitely some language barrier. He was seen by primary care on 11/30/2017 with complaints of abdominal discomfort.  He had abdominal films done which showed a moderate amount of retained stool consistent with constipation.  C met was unremarkable and H. pylori IgM was negative. I am not sure exactly how long he has been having symptoms but perhaps around 2 months.  He complains of gassiness bloating and fullness in his abdomen.  Appetite has been okay, weight is stable.  No dysphagia or odynophagia, he has been having indigestion.  He has noted constipation for a long time and may go anywhere from 2 to 3 days to a week without a bowel movement.  He is not taking any laxatives.  He denies any melena or hematochezia. He is uncertain about his family history as far as GI issues.   He denies regular EtOH use, no history of hepatitis.  Review of Systems Pertinent positive and negative review of systems were noted in the above HPI section.  All other review of systems was otherwise negative.  Outpatient Encounter Medications as of 01/08/2018  Medication Sig  . finasteride (PROSCAR) 5 MG tablet Take 5 mg by mouth daily.  . tamsulosin (FLOMAX) 0.4 MG CAPS capsule Take 0.8 mg by mouth daily.  Marland Kitchen docusate sodium (COLACE) 50 MG capsule Take 1 capsule (50 mg total) by mouth 2 (two) times daily.   No facility-administered encounter medications on file as of 01/08/2018.    No Known Allergies Patient  Active Problem List   Diagnosis Date Noted  . Closed nondisplaced fracture of distal phalanx of right great toe 04/07/2017  . Laceration of right great toe without foreign body present or damage to nail 04/07/2017  . Acute lumbar myofascial strain 02/13/2017  . Prostatitis, acute 01/30/2015  . Pain in joint, shoulder region 01/30/2015  . Osteoarthritis 01/02/2015   Social History   Socioeconomic History  . Marital status: Married    Spouse name: Not on file  . Number of children: Not on file  . Years of education: Not on file  . Highest education level: Not on file  Occupational History  . Not on file  Social Needs  . Financial resource strain: Not on file  . Food insecurity:    Worry: Not on file    Inability: Not on file  . Transportation needs:    Medical: Not on file    Non-medical: Not on file  Tobacco Use  . Smoking status: Current Every Day Smoker    Packs/day: 1.00    Types: Cigarettes  . Smokeless tobacco: Never Used  Substance and Sexual Activity  . Alcohol use: Yes    Alcohol/week: 4.2 oz    Types: 7 Cans of beer per week  . Drug use: No  . Sexual activity: Yes  Lifestyle  . Physical activity:    Days per week: Not on file    Minutes per session:  Not on file  . Stress: Not on file  Relationships  . Social connections:    Talks on phone: Not on file    Gets together: Not on file    Attends religious service: Not on file    Active member of club or organization: Not on file    Attends meetings of clubs or organizations: Not on file    Relationship status: Not on file  . Intimate partner violence:    Fear of current or ex partner: Not on file    Emotionally abused: Not on file    Physically abused: Not on file    Forced sexual activity: Not on file  Other Topics Concern  . Not on file  Social History Narrative   Married with 2 daughters and 1 son    Bobby [name] Family history is unknown by patient.      Objective:    Vitals:   01/08/18 0832    BP: 140/78  Pulse: 66  SpO2: 98%    Physical Exam; well-developed older Asian man in no acute distress, accompanied by his wife, pleasant blood pressure 140/78, pulse 66, height 5 foot 7, weight 188, BMI 28.8.  HEENT ;nontraumatic normocephalic EOMI PERRLA sclera anicteric, Cardiovascular ;regular rate and rhythm with S1-S2 no murmur rub or gallop, Pulmonary; clear bilaterally, Abdomen; soft, bowel sounds are present, no palpable mass or hepatosplenomegaly, he is tender in the right lower quadrant, he has an appendectomy incisional scar, no palpable fluid wave.  Rectal ;exam not done, Extremities; no clubbing cyanosis or edema skin warm and dry, Neuro psych; alert and oriented, grossly nonfocal mood and affect appropriate       Assessment & Plan:   #69 67 year old Bobby [name] male who does speak some English, but language barrier is present. Patient with 2 to 32-monthhistory of abdominal discomfort ,gassiness, bloating perhaps indigestion, and longer-term issues with constipation with bowel movement every 2 to 7 days.  Etiology of his symptoms is not clear.  Some of this may be secondary to GERD, rule out intra-abdominal inflammatory process, malignancy, underlying liver disease  #2-chronic constipation #3 colon cancer surveillance-no prior colonoscopy #4 status post appendectomy #5 history of prostatitis  Plan; I initially began discussing colonoscopy and upper endoscopy which I think are indicated.  Patient states that he has absolutely no one who could accompany him on the day of procedures and offer him transportation home.  He states that he drives but nobody else in his family does.  Will schedule for CT of the abdomen and pelvis with contrast Start MiraLAX 17 g in 8 ounces of water daily, also advised to increase water intake daily Check CBC with differential, BMET, and sed rate I will plan to see him back in the office in about 2 weeks, he needs to be accompanied by an interpreter  for future appointments. Patient will be established with Dr. JHerma MeringPA-C 01/08/2018   Cc: Bobby Eagles PA-C

## 2018-01-08 NOTE — Progress Notes (Signed)
I agree with the above note, plan 

## 2018-01-09 ENCOUNTER — Other Ambulatory Visit: Payer: Self-pay

## 2018-01-09 DIAGNOSIS — R1013 Epigastric pain: Secondary | ICD-10-CM

## 2018-01-09 DIAGNOSIS — D638 Anemia in other chronic diseases classified elsewhere: Secondary | ICD-10-CM

## 2018-01-15 ENCOUNTER — Ambulatory Visit (INDEPENDENT_AMBULATORY_CARE_PROVIDER_SITE_OTHER)
Admission: RE | Admit: 2018-01-15 | Discharge: 2018-01-15 | Disposition: A | Discharge status: Home / Self Care | Payer: Medicare HMO | Source: Ambulatory Visit | Attending: Physician Assistant | Admitting: Physician Assistant

## 2018-01-15 DIAGNOSIS — R1031 Right lower quadrant pain: Secondary | ICD-10-CM | POA: Diagnosis not present

## 2018-01-15 DIAGNOSIS — K5900 Constipation, unspecified: Secondary | ICD-10-CM | POA: Diagnosis not present

## 2018-01-15 DIAGNOSIS — R140 Abdominal distension (gaseous): Secondary | ICD-10-CM

## 2018-01-15 DIAGNOSIS — R1013 Epigastric pain: Secondary | ICD-10-CM

## 2018-01-15 DIAGNOSIS — R109 Unspecified abdominal pain: Secondary | ICD-10-CM | POA: Diagnosis not present

## 2018-01-15 MED ORDER — IOPAMIDOL (ISOVUE-300) INJECTION 61%
100.0000 mL | Freq: Once | INTRAVENOUS | Status: AC | PRN
  Administered 2018-01-15: 100 mL via INTRAVENOUS

## 2018-01-22 ENCOUNTER — Encounter: Payer: Self-pay | Admitting: Urgent Care

## 2018-01-22 ENCOUNTER — Ambulatory Visit (INDEPENDENT_AMBULATORY_CARE_PROVIDER_SITE_OTHER): Payer: Medicare HMO | Admitting: Urgent Care

## 2018-01-22 ENCOUNTER — Other Ambulatory Visit: Payer: Self-pay

## 2018-01-22 VITALS — BP 142/82 | HR 69 | Temp 97.6°F | Resp 16 | Ht 67.7 in | Wt 185.8 lb

## 2018-01-22 DIAGNOSIS — R0602 Shortness of breath: Secondary | ICD-10-CM

## 2018-01-22 DIAGNOSIS — R69 Illness, unspecified: Secondary | ICD-10-CM | POA: Diagnosis not present

## 2018-01-22 DIAGNOSIS — H5789 Other specified disorders of eye and adnexa: Secondary | ICD-10-CM | POA: Diagnosis not present

## 2018-01-22 DIAGNOSIS — Z23 Encounter for immunization: Secondary | ICD-10-CM

## 2018-01-22 DIAGNOSIS — H5713 Ocular pain, bilateral: Secondary | ICD-10-CM

## 2018-01-22 DIAGNOSIS — H538 Other visual disturbances: Secondary | ICD-10-CM | POA: Diagnosis not present

## 2018-01-22 DIAGNOSIS — H04203 Unspecified epiphora, bilateral lacrimal glands: Secondary | ICD-10-CM

## 2018-01-22 DIAGNOSIS — F17210 Nicotine dependence, cigarettes, uncomplicated: Secondary | ICD-10-CM

## 2018-01-22 DIAGNOSIS — Z1211 Encounter for screening for malignant neoplasm of colon: Secondary | ICD-10-CM

## 2018-01-22 DIAGNOSIS — I10 Essential (primary) hypertension: Secondary | ICD-10-CM

## 2018-01-22 DIAGNOSIS — F17200 Nicotine dependence, unspecified, uncomplicated: Secondary | ICD-10-CM

## 2018-01-22 LAB — LIPID PANEL
CHOL/HDL RATIO: 3.4 ratio (ref 0.0–5.0)
Cholesterol, Total: 146 mg/dL (ref 100–199)
HDL: 43 mg/dL (ref >39)
LDL Calculated: 83 mg/dL (ref 0–99)
TRIGLYCERIDES: 102 mg/dL (ref 0–149)
VLDL Cholesterol Cal: 20 mg/dL (ref 5–40)

## 2018-01-22 MED ORDER — AMLODIPINE BESYLATE 5 MG PO TABS: 5.0000 mg | ORAL_TABLET | Freq: Every day | ORAL | 3 refills | Status: DC

## 2018-01-22 MED ORDER — AZELASTINE HCL 0.05 % OP SOLN: 1.0000 [drp] | Freq: Two times a day (BID) | OPHTHALMIC | 1 refills | Status: DC

## 2018-01-22 MED ORDER — NICOTINE 14 MG/24HR TD PT24: 14.0000 mg | MEDICATED_PATCH | Freq: Every day | TRANSDERMAL | 0 refills | Status: DC
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
## 2018-01-22 NOTE — Patient Instructions (Addendum)
T?ng huy?t p Hypertension T?ng huy?t p, th??ng ???c g?i l huy?t p cao, l khi l?c b?m mu qua ??ng m?ch c?a qu v? qu m?nh. ??ng m?ch c?a qu v? l cc m?ch mu mang mu t? tim ?i kh?p c? th?. T?ng huy?t p khi?n tim lm vi?c v?t v? h?n ?? b?m mu v c th? khi?n cc ??ng m?ch tr? ln h?p ho?c c?ng. T?ng huy?t p khng ???c ?i?u tr? ho?c khng ki?m sot ???c c th? d?n t?i nh?i mu c? tim, ??t qu?, b?nh th?n v nh?ng v?n ?? khc. Ch? s? ?o huy?t p g?m m?t ch? s? cao trn m?t ch? s? th?p. Huy?t a?p ly? t???ng cu?a quy? vi? la? d??i 120/80. Ch? s? ??u tin ("??nh") ???c g?i l huy?t p tm thu. ?y l s? ?o p su?t trong ??ng m?ch khi tim qu v? ??p. Ch? s? th? hai ("?y") ???c g?i l huy?t p tm tr??ng. ?y l s? ?o p su?t trong ??ng m?ch khi tim qu v? ngh?Lourdes Sledge nhn g gy ra? Khng r nguyn nhn gy ra tnh tr?ng ny. ?i?u g lm t?ng nguy c?? M?t s? y?u t? nguy c? d?n ??n huy?t p cao c th? ki?m sot ???c. M?t s? y?u t? khc th khng. Nh?ng y?u t? qu v? c th? thay ??i  Ht thu?c.  B? b?nh ti?u ???ng tup 2, cholesterol cao, ho?c c? hai.  Khng t?p th? d?c ho?c cc ho?t ??ng th? ch?t ??y ??Marland Kitchen  Th?a cn.  ?n qu nhi?u ch?t bo, ???ng, ca-lo, ho?c mu?i (Natri).  U?ng qu nhi?u r??u. Nh?ng y?u t? kh ho?c khng th? thay ??i  B?nh th?n m?n tnh.  C ti?n s? gia ?nh b? cao huy?t p.  ?? tu?i. Nguy c? t?ng ln theo ?? tu?i.  Ch?ng t?c. Qu v? c th? c nguy c? cao h?n n?u qu v? l ng??i M? g?c Phi.  Gi?i tnh. Nam gi?i c nguy c? cao h?n ph? n? tr??c tu?i 45. Sau tu?i 65, ph? n? c nguy c? cao h?n nam gi?i.  Ng?ng th? do t?c ngh?n khi ng?.  C?ng th?ng. Cc d?u hi?u ho?c tri?u ch?ng l g? Huy?t p qu cao (c?n t?ng huy?t p) c th? gy ra:  ?au ??u.  Lo u.  Kh th?.  Ch?y mu cam.  Bu?n nn v nn.  ?au ng?c n?ng.  C? ??ng gi?t gi?t qu v? khng th? ki?m sot ???c (co gi?t).  Ch?n ?on tnh tr?ng ny nh? th? no? Tnh tr?ng ny ???c ch?n ?on b?ng  cch ?o huy?t p c?a qu v? lc qu v? ng?i, ?? tay trn m?t m?t ph?ng. B?ng qu?n thi?t b? ?o huy?t p s? ???c qu?n tr?c ti?pvo vng da cnh tay pha trn c?a qu v? ngang v?i m?c tim. Huy?t p c?n ???c ?o t nh?t hai l?n trn cng m?t cnh tay. M?t s? tnh tr?ng nh?t ??nh c th? lm cho huy?t p khc nhau gi?a tay ph?i v tay tri c?a qu v?. M?t s? y?u t? nh?t ??nh c th? khi?n ch? s? ?o huy?t p th?p h?n ho?c cao h?n so v?i bnh th??ng (t?ng) trong th?i gian ng?n:  Khi huy?t p c?a qu v? ? phng khm c?a chuyn gia ch?m [place] s?c kh?e cao h?n so v?i lc qu v? ? nh, hi?n t??ng ny ???c g?i l t?ng huy?t p o chong tr?ng. H?u h?t nh?ng ng??i b? tnh tr?ng ny ??u khng c?n dng thu?c.  Khi huy?t  p c?a qu v? lc ? nh cao h?n so v?i lc qu v? ? phng khm chuyn gia ch?m [place] s?c kh?e, hi?n t??ng ny ???c g?i l t?ng huy?t p m?t n?. H?u h?t nh?ng ng??i b? tnh tr?ng ny ??u c th? c?n dng thu?c ?? ki?m sot huy?t p.  N?u qu v? c ch? s? huy?t p cao trong m?t l?n khm ho?c qu v? c huy?t p bnh th??ng c km cc y?u t? nguy c? khc:  Qu v? c th? ???c yu c?u tr? l?i vo m?t ngy khc ?? ki?m tra l?i huy?t p.  Qu v? c th? ???c yu c?u theo di huy?t p t?i nh trong vng 1 tu?n ho?c lu h?n.  N?u qu v? ???c ch?n ?on b? t?ng huy?t p, qu v? c th? c?n th?c hi?n cc xt nghi?m mu ho?c ki?m tra hnh ?nh khc ?? gip chuyn gia ch?m [place] s?c kh?e hi?u nguy c? t?ng th? m?c cc b?nh tr?ng khc. Tnh tr?ng ny ???c ?i?u tr? nh? th? no? Tnh tr?ng ny ???c ?i?u tr? b?ng cch thay ??i l?i s?ng lnh m?nh, ch?ng h?nh nh? ?n th?c ph?m c l?i cho s?c kh?e, t?p th? d?c nhi?u h?n v gi?m l??ng r??u u?ng vo. N?u thay ??i l?i s?ng khng ?? ?? ??a huy?t p v? m?c c th? ki?m sot ???c, chuyn gia ch?m [place] s?c kh?e c th? k ??n thu?c, v n?u:  Huy?t p tm thu c?a qu v? trn 130.  Huy?t p tm tr??ng c?a qu v? trn 80.  Huy?t p m?c tiu c nhn c?a qu v? c th? khc nhau ty thu?c v tnh  tr?ng b?nh l, tu?i v cc nhn t? khc. Tun th? nh?ng h??ng d?n ny ? nh: ?n v u?ng  ?n ch? ?? giu ch?t x? v kali v t natri, ???ng ph? gia v ch?t bo. M?t k? ho?ch ?n m?u c tn ch? ?? ?n DASH (Cch ti?p c?n ?n u?ng ?? gi?m t?ng huy?t p). ?n theo cch ny: ? ?n nhi?u tri cy v rau t??i. Vo m?i b?a ?n, c? g?ng dnh m?t n?a ??a cho tri cy v rau. ? ?n ng? c?c nguyn h?t, ch?ng h?n nh? m ?ng lm t? b?t m nguyn cm, ho?c bnh m nguyn h?t. Cho ngu? c?c nguyn ca?m va?o m?t ph?n t? ??a c?a quy? vi?. ? ?n ho?c hu?ng cc s?n ph?m t? s?a t bo, ch?ng h?n nh? s?a ? b? kem ho?c s?a chua t bo. ? Trnh nh?ng mi?ng th?t nhi?u m?, th?t ? qua ch? bi?n ho?c th?t ??p mu?i v th?t gia c?m c da. Dnh kho?ng m?t ph?n t? ??a c?a qu v? cho cc protein khng m?, ch?ng h?n nh? c, th?t g khng da, ??u, tr?ng, v ??u ph?. ? Trnh nh?ng th?c ph?m ch? bi?n ho?c lm s?n. Nh?ng th?c ph?m ny th??ng c nhi?u natri, ???ng ph? gia v ch?t bo h?n.  Gi?m l??ng dng natri hng ngy c?a qu v?. H?u h?t nh?ng ng??i b? t?ng huy?t p ??u nn ?n d??i 1.500 mg natri m?i ngy.  Gi?i h?n l??ng r??u qu v? u?ng khng qu 1 ly m?i ngy v?i ph? n? khng mang thai v 2 ly m?i ngy v?i nam gi?i. M?t ly t??ng ???ng v?i 12 ao-x? bia, 5 ao-x? r??u vang, ho?c 1 ao-x? r??u m?nh. L?i s?ng  H?p tc v?i chuyn gia ch?m [place] s?c kh?e c?a qu v? ?? duy tr tr?ng l??ng c? th? c l?i cho  s?c kh?e ho?c gi?m cn. Hy h?i xem tr?ng l??ng no l l t??ng cho qu v?.  Dnh t nh?t 30 pht ?? t?p th? d?c m c th? khi?n tim qu v? ??p nhanh h?n (t?p th? d?c nh?p ?i?u) h?u h?t cc ngy trong tu?n. Cc ho?t ??ng c th? bao g?m ?i b?, b?i, ho?c ??p xe.  Bao g?m bi t?p t?ng c??ng c? (bi t?p khng l?c), ch?ng h?n nh? bi t?p [place] ho?c nng t?, nh? m?t ph?n c?a thi quen luy?n t?p hng tu?n c?a qu v?. C? g?ng t?p nh?ng lo?i bi t?p ny trong vng 30 pht t?i thi?u 3 ngy m?t tu?n.  Khng s? d?ng b?t k? s?n ph?m no ch?a nicotine  ho?c thu?c l, ch?ng ha?n nh? thu?c l d?ng ht v thu?c l ?i?n t?. N?u qu v? c?n gip ?? ?? cai thu?c, hy h?i chuyn gia ch?m [place] s?c kh?e.  Theo di huy?t p c?a qu v? t?i nh theo h??ng d?n c?a chuyn gia ch?m [place] s?c kh?e.  Tun th? t?t c? cc cu?c h?n khm l?i theo ch? d?n c?a chuyn gia ch?m [place] s?c kh?e. ?i?u ny c vai tr quan tr?ng. Thu?c  Ch? s? d?ng thu?c khng k ??n v thu?c k ??n theo ch? d?n c?a chuyn gia ch?m [place] s?c kh?e. Lm theo ch? d?n m?t cch c?n th?n. Thu?c ?i?u tr? huy?t p ph?i ???c dng theo ??n ? k.  Khng b? li?u thu?c huy?t p. B? li?u khi?n qu v? c nguy c? g?p ph?i cc v?n ?? v c th? lm cho thu?c gi?m hi?u qu?Marland Kitchen  Hy h?i chuyn gia ch?m [place] s?c kh?e c?a qu v? v? nh?ng tc d?ng ph? ho?c ph?n ?ng v?i thu?c m qu v? ph?i theo di. Hy lin l?c v?i chuyn gia ch?m [place] s?c kh?e n?u:  Qu v? ngh? qu v? c ph?n ?ng v?i thu?c ?ang dng.  Qu v? b? ?au ??u ti?p t?c tr? l?i (ti pht).  Qu v? c?m th?y chng m?t.  Qu v? b? s?ng ph ? m?t c chn.  Qu v? c v?n ?? v? th? l?c. Yu c?u tr? gip ngay l?p t?c n?u:  Qu v? b? ?au ??u n?ng ho?c l l?n.  Qu v? b? y?u b?t th??ng ho?c t b.  Quy? vi? ca?m th?y bi? ng?t.  Qu v? b? ?au r?t nhi?u ? ng?c ho?c b?ng.  Qu v? nn nhi?u l?n.  Qu v? b? kh th?. Tm t?t  T?ng huy?t p l khi l?c b?m mu qua ??ng m?ch c?a qu v? qu m?nh. N?u tnh tr?ng ny khng ???c ki?m sot, n c th? khi?n qu v? g?p ph?i nguy c? bi?n ch?ng nghim tr?ng.  Huy?t p m?c tiu c nhn c?a qu v? c th? khc nhau ty thu?c v tnh tr?ng b?nh l, tu?i v cc nhn t? khc. ??i v?i h?u h?t m?i ng??i, huy?t p bnh th??ng l d??i 120/80.  ?i?u tr? t?ng huy?t p b?ng cch thay ??i l?i s?ng, dng thu?c, ho?c k?t h?p c? hai. Thay ??i l?i s?ng bao g?m gi?m cn, ?n ch? ?? ?n c l?i cho s?c kh?e, t mu?i, t?p th? d?c nhi?u h?n v h?n ch? u?ng r??u. Thng tin ny khng nh?m m?c ?ch thay th? cho l?i khuyn m chuyn gia ch?m   s?c kh?e ni v?i qu v?. Hy b?o ??m qu v? ph?i th?o lu?n b?t k? v?n ?? g m qu v? c v?i chuyn gia ch?m  s?c  kh?e c?a qu v?. Document Released: 08/22/2005 Document Revised: 08/03/2016 Document Reviewed: 08/03/2016 Elsevier Interactive Patient Education  2018 [employer].    Amlodipine tablets ?y l thu?c g? AMLODIPINE l thu?c ch?n knh calci. N tc ??ng ??n l??ng calci tm th?y trong cc t? bo c? v tim c?a qu v?. ?i?u ny lm gin m?ch mu, m nh? v?y c th? gi?m kh?i l??ng cng vi?c m tim ph?i lm. Thu?c ny ???c dng ?? h? huy?t p. N c?ng ???c dng ?? phng ng?a ?au ng?c. Thu?c ny c th? ???c dng cho nh?ng m?c ?ch khc; hy h?i ng??i cung c?p d?ch v? y t? ho?c d??c s? c?a mnh, n?u qu v? c th?c m?c. (CC) NHN HI?U PH? BI?N: Norvasc Ti c?n ph?i bo cho ng??i cung c?p d?ch v? y t? c?a mnh ?i?u g tr??c khi dng thu?c ny? H? c?n bi?t li?u qu v? hi?n c b?t k? tnh tr?ng no sau ?y hay khng: -cc v?n ?? v? tim, bao g?m suy tim ho?c h?p ??ng m?ch ch? -b?nh gan -pha?n ??ng b?t th???ng ho??c di? ??ng v??i amlodipine -pha?n ??ng b?t th???ng ho??c di? ??ng v??i ca?c d??c ph?m kha?c -pha?n ??ng b?t th???ng ho??c di? ??ng v??i th??c ph?m, thu?c nhu?m, ho??c ch?t ba?o qua?n -?ang c thai ho??c ??nh co? thai -?ang cho con bu? Ti nn s? d?ng thu?c ny nh? th? no? U?ng thu?c ny v?i m?t ly n??c. Hy lm theo cc h??ng d?n trn h?p thu?c ho?c nhn thu?c. Dng thu?c ny vo nh?ng kho?ng th?i gian ??u nhau. Khng ???c dng nhi?u thu?c h?n ? ???c ch? d?n. Hy bn v?i bc s? nhi khoa c?a qu v? v? vi?c dng thu?c ny ? tr? em. C th? c?n ch?m [place] ??c bi?t. Thu?c ny ? ???c dng cho tr? nh? ch? m?i 6 tu?i. Nh?ng b?nh nhn trn 65 tu?i c th? c ph?n ?ng m?nh h?n v?i thu?c ny v c?n nh?ng l??ng phun nh? h?n. Qu li?u: N?u qu v? cho r?ng mnh ? dng qu nhi?u thu?c ny, th hy lin l?c v?i trung tm ki?m sot ch?t ??c ho?c phng c?p c?u ngay l?p t?c. L?U : Thu?c  ny ch? dnh ring cho qu v?. Khng chia s? thu?c ny v?i nh?ng ng??i khc. N?u ti l? qun m?t li?u th sao? N?u qu v? l? qun m?t li?u thu?c, hy dng li?u thu?c ? ngay khi c th?. N?u h?u nh? ? ??n gi? dng li?u thu?c k? ti?p, th ch? dng li?u thu?c k? ti?p ?Marland Kitchen Khng ???c dng li?u g?p ?i ho?c dng thm li?u. Nh?ng g c th? t??ng tc v?i thu?c ny? -cc ch? ph?m b? sung thu?c th?o d??c ho?c ti?t th?c (ch? ?? ?n) -cc thu?c gy m ton thn ho?c gy t c?c b? -cc thu?c dng ?? tr? huy?t p cao -cc thu?c dng ?? tr? cc v?n ?? v? nhi?p h? tuy?n -rifampin Danh sch ny c th? khng m t? ?? h?t cc t??ng tc c th? x?y ra. Hy ??a cho ng??i cung c?p d?ch v? y t? c?a mnh danh sch t?t c? cc thu?c, th?o d??c, cc thu?c khng c?n toa, ho?c cc ch? ph?m b? sung m qu v? dng. C?ng nn bo cho h? bi?t r?ng qu v? c ht thu?c, u?ng r??u, ho?c c s? d?ng ma ty tri php hay khng. Vi th? c th? t??ng tc v?i thu?c c?a qu v?. Ti c?n ph?i theo di ?i?u g trong khi dng thu?c ny? Hy ??n  g?p bc s? ho?c [place] vin y t? ?? theo di ??nh k? s?c kh?e c?a mnh. Hy ki?m tra huy?t p v m?ch th??ng xuyn. Hy h?i bc s? ho?c chuyn vin y t? ?? xem nh?p tim v huy?t p c?a qu v? nn ? m?c no, v khi no th qu v? c?n lin l?c v?i h?. Thu?c ny c th? lm cho qu v? c?m th?y b? l?n th?n, chng m?t ho?c chong vng. Khng ???c li xe, s? d?ng my mc, ho?c lm nh?ng vi?c c?n ph?i t?nh to cho t?i khi qu v? bi?t ???c thu?c ny ?nh h??ng ln qu v? nh? th? no. Khng ???c ng?i d?y ho?c ??ng d?y nhanh, ??c bi?t l khi qu v? l b?nh nhn l?n tu?i. ?i?u ny lm gi?m nguy c? b? chng m?t ho?c ng?t x?u. R??u c th? lm cho qu v? chng m?t v bu?n ng? h?n. Hy trnh cc ?? u?ng c r??u. Khng ???c ??t ng?t ng?ng dng amlodipine. Hy h?i bc s? ho?c chuyn vin y t? lm sao ?? gi?m li?u dng t? t?. Ti c th? nh?n th?y nh?ng tc d?ng ph? no khi dng thu?c ny? Nh?ng tc d?ng ph? qu v? c?n ph?i bo  cho bc s? ho?c chuyn vin y t? cng s?m cng t?t: -cc ph?n ?ng d? ?ng, ch?ng h?n nh? da b? m?n ??, ng?a, n?i my ?ay, s?ng ? m?t, mi, ho?c l??i -cc v?n ?? v? h h?p -thay ??i th? l?c -thay ??i thnh l?c -?au ng?c -tim ??p nhanh ho?c khng ??u -s?ng ? chn ho?c m?t c chn Cc tc d?ng ph? khng c?n ph?i ch?m [place] y t? (hy bo cho bc s? ho?c chuyn vin y t?, n?u cc tc d?ng ph? ny ti?p di?n ho?c gy phi?n toi): -kh mi?ng -ph?ng m?t -bu?n i ho?c i m?a -?au b?ng ho?c ??y h?i -m?t m?i ho?c y?u ?t -kh ng? Danh sch ny c th? khng m t? ?? h?t cc tc d?ng ph? c th? x?y ra. Xin g?i t?i bc s? c?a mnh ?? ???c c? v?n chuyn mn v? cc tc d?ng ph?[name] v? c th? t??ng trnh cc tc d?ng ph? cho FDA theo s? [phone]. Ti nn c?t gi? thu?c c?a mnh ? ?u? ?? ngoi t?m tay tr? em. C?t gi? ? nhi?t ?? phng t? 15 ??n 30 ?? C (60 ??n 87 ?? F). Trnh nh sng. ?ng ch?t gi/h?p thu?c. V?t b? t?t c? thu?c ch?a dng sau ngy h?t h?n in trn nhn thu?c ho?c bao thu?c. L?U : ?y l b?n tm t?t. N c th? khng bao hm t?t c? thng tin c th? c. N?u qu v? th?c m?c v? thu?c ny, xin trao ??i v?i bc s?, d??c s?, ho?c ng??i cung c?p d?ch v? y t? c?a mnh.  2018 Elsevier/Gold Standard (2016-09-22 00:00:00)      Thng tin v? ht thu?c l Smoking Tobacco Information Ht thu?c l nhi?u kh? n?ng s? c h?i cho s?c kh?e c?a qu v?. Thu?c l c ch?a m?t ch?t ha h?c khng mu, ??c h?i (??c), c tn l nicotine. Nicotine ?nh h??ng ??n no v gy nghi?n thu?c l. S? thay ??i ny ? no khi?n kh c th? b? ht thu?c. Thu?c l c?ng c cc ch?t ha h?c ??c khc c th? gy t?n th??ng c? th? v gia t?ng nguy c? b? nhi?u b?nh ung th?. Thu?c l c th? ?nh h??ng ??n ti nh? th? no? Ht thu?c l c th? lm  t?ng kh? n?ng qu v? b? cc v?n ?? s?c kh?e nghim tr?ng, ch?ng h?n nh?:  Ung th?. Ht thu?c l th??ng ?i km nhi?u nh?t v?i ung th? ph?i, nh?ng c th? d?n ??n ung th? cc b? ph?n khc c?a c?  th?.  B?nh ph?i t?c ngh?n m?n tnh (COPD). ?y l tnh tr?ng ko di ? ph?i gy kh th?. B?nh c?ng tr? nn tr?m tr?ng h?n theo th?i gian.  Huy?t p cao (t?ng huy?t p), b?nh tim, ??t qu?, ho?c nh?i mu c? tim.  Nhi?m trng ph?i, ch?ng h?n nh? vim ph?i.  ??c th?y tinh th?. ?y l tnh tr?ng khi th?y tinh th? trong m?t b? m? ??c.  Cc v?n ?? tiu ha. ?i?u ny c th? bao g?m lot d? dy, ? nng v b?nh tro ng??c d? dy th?c qu?n (GERD).  Cc v?n ?? s?c kh?e r?ng mi?ng, ch?ng h?n nh? b?nh ? n??u v r?ng r?ng.  M?t v? khc v kh?u gic.  Ht thu?c c th? ?nh h??ng ??n b? ngoi v gy ra:  V?t nh?n.  R?ng, ngn tay v mng tay vng ho?c x?n mu.  Ht thu?c l c?ng c th? ?nh h??ng ??n ??i s?ng x h?i c?a qu v?.  Nhi?u n?i lm vi?c, nh hng, khch s?n v n?i cng c?ng khng c ht thu?c l. ?i?u ny c ngh?a l qu v? c th? g?p ph?i thch th?c trong vi?c tm ki?m ch? ht thu?c khi ra kh?i nh.  Ph t?n c?a thi quen ht thu?c c th? ??t ??. Cc chi ph cho m?t ng??i ht thu?c ??n theo hai con ???ng: ? Qu v? th??ng xuyn t?n ti?n ?? mua thu?c l. ? Chi ph ch?m [place] s?c kh?e v? di h?n s? cao h?n n?u qu v? ht thu?c.  Ht thu?c l c?ng c th? ?nh h??ng ??n s?c kh?e c?a m?i ng??i xung quanh. Con c?a nh?ng ng??i ht thu?c c kh? n?ng cao h?n b?: ? H?i ch?ng tr? s? sinh ??t t? (SIDS). ? Nhi?m trng tai. ? Nhi?m trng ph?i.  C th? th?c hi?n nh?ng thay ??i no v? l?i s?ng?  ??ng b?t ??u ht thu?c. Cai thu?c n?u qu v? ? ht.  ?? cai thu?c l: ? L?p m?t k? ho?ch cai thu?c v t? cam k?t th?c hi?n. Tm cc ch??ng trnh s? gip qu v? v xin l?i [place] v  t??ng t? chuyn gia ch?m [place] s?c kh?e. ? Trao ??i v?i chuyn gia ch?m [place] s?c kh?e v? vi?c s? d?ng thu?c thay th? nicotine ?? gip qu v? cai thu?c. Cc thu?c thay th? nicotine bao g?m k?o nhai, thu?c d?ng k?o dn, mi?ng dn, thu?c x?t ho?c thu?c vin. ? Khng thay th? thu?c l d?ng ht b?ng thu?c l ?i?n t?, th??ng ???c g?i l  e-cigarette. V?n ch?a r ?? an ton c?a thu?c l ?i?n t? v m?t s? lo?i c th? ch?a cc ha ch?t c h?i. ? Trnh nh?ng n?i, ng??i ho?c tnh hu?ng khi?n qu v? thm ht thu?c. ? N?u qu v? cai thu?c nh?ng ? ht tr? l?i, ??ng t? b? hy v?ng. Vi?c m?i ng??i th? nhi?u l?n tr??c khi thnh cng hon ton l r?t ph? bi?n. Khi qu v? c?m th?y s?n sng tr? l?i, hy th? thm m?t l?n n?a.  Cai thu?c l c th? ?nh h??ng ??n cch ?n u?ng c?ng nh? cn n?ng c?a qu v?. Hy chu?n b? ?? theo di cc thi quen ?n u?ng c?a qu v?. Tm  s? h? tr? trong vi?c l?p k? ho?ch v tun th? m?t ch? ?? ?n c l?i cho s?c kh?e.  Hy h?i chuyn gia ch?m [place] s?c kh?e v? vi?c xt nghi?m ??nh k? (khm sng l?c) ?? ki?m tra b?nh ung th?. Nh?ng xt nghi?m ny c th? bao g?m xt nghi?m mu, ki?m tra hnh ?nh v xt nghi?m khc.  T?p th? d?c th??ng xuyn. Cn nh?c ??n vi?c ?i b?, tham gia m?t phng t?p th? hnh, ho?c t?p yoga ho?c cc l?p t?p th? d?c.  Pht tri?n cc k? n?ng qu?n l c?ng th?ng. Nh?ng k? n?ng ny bao g?m thi?n. Nh?ng l?i ch c?a vi?c cai thu?c l l g? B?ng cch cai thu?c l, qu v? c th?:  Lm gi?m nguy c? b? ung th? v cc b?nh khc do ht thu?c gy ra.  S?ng lu h?n.  Th? t?t h?n.  H? huy?t p v nh?p tim.  Ng?ng ph? thu?c vo thu?c l.  Ng?ng t?o khi thu?c th? ??ng gy t?n th??ng ng??i khc.  C?i thi?n kh?u gic v v? gic.  Nhn tr? trung h?n theo th?i gian, v c t n?p nh?n h?n v r?ng v da t b? x?n mu.  ?i?u g c th? x?y ra n?u khng thay ??i? N?u khng cai thu?c l, qu v? c th?:  B? ung th? v cc b?nh khc.  B? b?nh ph?i t?c ngh?n m?n tnh (COPD) ho?c cc tnh tr?ng ko di (m?n tnh) khc ? ph?i.  B? cc v?n ?? nghim tr?ng v?i tim v m?ch mu (h? tim m?ch).  C?n nhi?u xt nghi?m h?n ?? sng l?c cc v?n ?? do ht thu?c gy ra.  C chi ph ch?m [place] s?c kh?e di h?n cao h?n t? cc lo?i thu?c ho?c cc ph??ng php ?i?u tr? lin quan ??n ht thu?c.  Ti?p t?c c nh?ng thay ??i tr?m  tr?ng h?n ? ph?i, mi?ng v m?i.  Tm ki?m tr? gip ? ?u: ?? ???c h? tr? cai thu?c l, hy cn nh?c:  H?i chuyn gia ch?m [place] s?c kh?e ?? bi?t thm thng tin v ngu?n ti nguyn.  Tham gia cc l?p h?c ?? tm hi?u thm v? cai thu?c l.  Tm ki?m cc t? ch?c ? ??a ph??ng cung c?p ngu?n ti nguyn v? cai thu?c l.  Gia nh?p m?t nhm h? tr? cho nh?ng ng??i mu?n cai thu?c l ? c?ng ??ng ? ??a ph??ng qu v?.  N?i ?? tm thm thng tin: Qu v? c th? tm thm thng tin v? cch cai thu?c l t?:  HelpGuide.org: www.helpguide.org/articles/addictions/how-to-quit-smoking.htm  https://hall.com/: smokefree.gov  Hi?p h?i Ph?i [place] k?: www.lung.org  Hy lin l?c v?i chuyn gia ch?m [place] s?c kh?e n?u:  Qu v? b? cc v?n ?? h h?p.  Mi, m?i ho?c ngn tay qu v? chuy?n sang mu xanh.  Qu v? b? ?au ng?c.  Ho ra mu.  Qu v? c?m th?y mu?n ng?t ho?c qu v? ng?t.  Qu v? c cc thay ??i r rng khc khi?n b?n thn lo l?ng. Tm t?t  Ht thu?c l c th? ?nh h??ng tiu c?c ??n s?c kh?e c?a qu v?, s?c kh?e c?a nh?ng ng??i xung quanh, ti chnh v ??i s?ng x h?i c?a qu v?.  ??ng b?t ??u ht thu?c. Cai thu?c n?u qu v? ? ht. N?u qu v? c?n gip ?? ?? cai thu?c, hy h?i chuyn gia ch?m [place] s?c kh?e.  Suy ngh? v? vi?c gia nh?p m?t nhm h? tr? cho nh?ng  ng??i mu?n cai thu?c l ? c?ng ??ng ? ??a ph??ng qu v?. C nhi?u ch??ng trnh hi?u qu? s? gip qu v? b? hnh vi ny. Thng tin ny khng nh?m m?c ?ch thay th? cho l?i khuyn m chuyn gia ch?m [place] s?c kh?e ni v?i qu v?. Hy b?o ??m qu v? ph?i th?o lu?n b?t k? v?n ?? g m qu v? c v?i chuyn gia ch?m [place] s?c kh?e c?a qu v?. Document Released: 01/03/2017 Document Revised: 01/03/2017 Document Reviewed: 01/03/2017 Elsevier Interactive Patient Education  2018 [employer].      IF you received an x-ray today, you will receive an invoice from [facility]. Please contact [facility] at [phone] with questions or concerns  regarding your invoice.   IF you received labwork today, you will receive an invoice from [place]. Please contact [facility] at [phone] with questions or concerns regarding your invoice.   Our billing staff will not be able to assist you with questions regarding bills from these companies.  You will be contacted with the lab results as soon as they are available. The fastest way to get your results is to activate your My Chart account. Instructions are located on the last page of this paperwork. If you have not heard from [place] regarding the results in 2 weeks, please contact this office.

## 2018-01-24 ENCOUNTER — Ambulatory Visit: Payer: Medicare HMO | Admitting: Physician Assistant

## 2018-01-24 ENCOUNTER — Encounter: Payer: Self-pay | Admitting: Physician Assistant

## 2018-01-24 ENCOUNTER — Other Ambulatory Visit (INDEPENDENT_AMBULATORY_CARE_PROVIDER_SITE_OTHER): Payer: Medicare HMO

## 2018-01-24 VITALS — BP 150/70 | HR 68 | Ht 67.17 in | Wt 186.9 lb

## 2018-01-24 DIAGNOSIS — D638 Anemia in other chronic diseases classified elsewhere: Secondary | ICD-10-CM

## 2018-01-24 DIAGNOSIS — Z1211 Encounter for screening for malignant neoplasm of colon: Secondary | ICD-10-CM | POA: Diagnosis not present

## 2018-01-24 DIAGNOSIS — R109 Unspecified abdominal pain: Secondary | ICD-10-CM

## 2018-01-24 DIAGNOSIS — K5900 Constipation, unspecified: Secondary | ICD-10-CM | POA: Diagnosis not present

## 2018-01-24 DIAGNOSIS — R1013 Epigastric pain: Secondary | ICD-10-CM

## 2018-01-24 LAB — IRON: IRON: 67 ug/dL (ref 42–165)

## 2018-01-24 LAB — FERRITIN: Ferritin: 424.7 ng/mL — ABNORMAL HIGH (ref 22.0–322.0)

## 2018-01-24 LAB — IBC PANEL
IRON: 67 ug/dL (ref 42–165)
SATURATION RATIOS: 20.9 % (ref 20.0–50.0)
Transferrin: 229 mg/dL (ref 212.0–360.0)

## 2018-01-24 MED ORDER — PEG 3350-KCL-NA BICARB-NACL 420 G PO SOLR: 4000.0000 mL | Freq: Once | ORAL | 0 refills | Status: AC

## 2018-01-24 NOTE — Patient Instructions (Signed)
Your provider has requested that you go to the basement level for lab work before leaving today. Press "B" on the elevator. The lab is located at the first door on the left as you exit the elevator.  Start Miralax 17 grams in 8 oz of water daily.  If you are age 66 or older, your body mass index should be between 23-30. Your Body mass index is 29.12 kg/m. If this is out of the aforementioned range listed, please consider follow up with your Primary Care Provider.

## 2018-01-24 NOTE — Addendum Note (Signed)
Addended by: Trenda Moots on: 01/07/1363 10:00 AM   Modules accepted: Orders
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
## 2018-01-25 LAB — IRON AND TIBC
Iron Saturation: 25 % (ref 15–55)
Iron: 69 ug/dL (ref 38–169)
Total Iron Binding Capacity: 276 ug/dL (ref 250–450)
UIBC: 207 ug/dL (ref 111–343)

## 2018-01-25 NOTE — Progress Notes (Signed)
I agree with the above note, plan 

## 2018-01-30 ENCOUNTER — Encounter: Payer: Self-pay | Admitting: *Deleted

## 2018-02-02 DIAGNOSIS — Z1211 Encounter for screening for malignant neoplasm of colon: Secondary | ICD-10-CM | POA: Diagnosis not present

## 2018-02-09 DIAGNOSIS — H353132 Nonexudative age-related macular degeneration, bilateral, intermediate dry stage: Secondary | ICD-10-CM | POA: Diagnosis not present

## 2018-02-09 DIAGNOSIS — H25013 Cortical age-related cataract, bilateral: Secondary | ICD-10-CM | POA: Diagnosis not present

## 2018-02-09 DIAGNOSIS — H35033 Hypertensive retinopathy, bilateral: Secondary | ICD-10-CM | POA: Diagnosis not present

## 2018-02-09 DIAGNOSIS — H35372 Puckering of macula, left eye: Secondary | ICD-10-CM | POA: Diagnosis not present

## 2018-02-13 ENCOUNTER — Other Ambulatory Visit: Payer: Self-pay | Admitting: Urgent Care

## 2018-03-06 ENCOUNTER — Ambulatory Visit: Payer: Self-pay | Admitting: Urgent Care

## 2018-03-07 ENCOUNTER — Other Ambulatory Visit: Payer: Self-pay

## 2018-03-07 ENCOUNTER — Encounter: Payer: Self-pay | Admitting: Gastroenterology

## 2018-03-07 ENCOUNTER — Ambulatory Visit (INDEPENDENT_AMBULATORY_CARE_PROVIDER_SITE_OTHER): Payer: Medicare HMO | Admitting: Urgent Care

## 2018-03-07 ENCOUNTER — Encounter: Payer: Self-pay | Admitting: Urgent Care

## 2018-03-07 VITALS — BP 143/67 | HR 82 | Temp 97.8°F | Resp 16 | Ht 67.0 in | Wt 190.8 lb

## 2018-03-07 DIAGNOSIS — R030 Elevated blood-pressure reading, without diagnosis of hypertension: Secondary | ICD-10-CM

## 2018-03-07 DIAGNOSIS — R69 Illness, unspecified: Secondary | ICD-10-CM | POA: Diagnosis not present

## 2018-03-07 DIAGNOSIS — R911 Solitary pulmonary nodule: Secondary | ICD-10-CM

## 2018-03-07 DIAGNOSIS — F17200 Nicotine dependence, unspecified, uncomplicated: Secondary | ICD-10-CM

## 2018-03-07 DIAGNOSIS — D649 Anemia, unspecified: Secondary | ICD-10-CM

## 2018-03-07 DIAGNOSIS — I10 Essential (primary) hypertension: Secondary | ICD-10-CM

## 2018-03-07 MED ORDER — LISINOPRIL 20 MG PO TABS: 20.0000 mg | ORAL_TABLET | Freq: Every day | ORAL | 1 refills | Status: DC
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
## 2018-03-07 NOTE — Patient Instructions (Signed)
Hypertension Hypertension is another name for high blood pressure. High blood pressure forces your heart to work harder to pump blood. This can cause problems over time. There are two numbers in a blood pressure reading. There is a top number (systolic) over a bottom number (diastolic). It is best to have a blood pressure below 120/80. Healthy choices can help lower your blood pressure. You may need medicine to help lower your blood pressure if:  Your blood pressure cannot be lowered with healthy choices.  Your blood pressure is higher than 130/80.  Follow these instructions at home: Eating and drinking  If directed, follow the DASH eating plan. This diet includes: ? Filling half of your plate at each meal with fruits and vegetables. ? Filling one quarter of your plate at each meal with whole grains. Whole grains include whole wheat pasta, brown rice, and whole grain bread. ? Eating or drinking low-fat dairy products, such as skim milk or low-fat yogurt. ? Filling one quarter of your plate at each meal with low-fat (lean) proteins. Low-fat proteins include fish, skinless chicken, eggs, beans, and tofu. ? Avoiding fatty meat, cured and processed meat, or chicken with skin. ? Avoiding premade or processed food.  Eat less than 1,500 mg of salt (sodium) a day.  Limit alcohol use to no more than 1 drink a day for nonpregnant women and 2 drinks a day for men. One drink equals 12 oz of beer, 5 oz of wine, or 1 oz of hard liquor. Lifestyle  Work with your doctor to stay at a healthy weight or to lose weight. Ask your doctor what the best weight is for you.  Get at least 30 minutes of exercise that causes your heart to beat faster (aerobic exercise) most days of the week. This may include walking, swimming, or biking.  Get at least 30 minutes of exercise that strengthens your muscles (resistance exercise) at least 3 days a week. This may include lifting weights or pilates.  Do not use any  products that contain nicotine or tobacco. This includes cigarettes and e-cigarettes. If you need help quitting, ask your doctor.  Check your blood pressure at home as told by your doctor.  Keep all follow-up visits as told by your doctor. This is important. Medicines  Take over-the-counter and prescription medicines only as told by your doctor. Follow directions carefully.  Do not skip doses of blood pressure medicine. The medicine does not work as well if you skip doses. Skipping doses also puts you at risk for problems.  Ask your doctor about side effects or reactions to medicines that you should watch for. Contact a doctor if:  You think you are having a reaction to the medicine you are taking.  You have headaches that keep coming back (recurring).  You feel dizzy.  You have swelling in your ankles.  You have trouble with your vision. Get help right away if:  You get a very bad headache.  You start to feel confused.  You feel weak or numb.  You feel faint.  You get very bad pain in your: ? Chest. ? Belly (abdomen).  You throw up (vomit) more than once.  You have trouble breathing. Summary  Hypertension is another name for high blood pressure.  Making healthy choices can help lower blood pressure. If your blood pressure cannot be controlled with healthy choices, you may need to take medicine. This information is not intended to replace advice given to you by your health care   provider. Make sure you discuss any questions you have with your health care provider. Document Released: 02/08/2008 Document Revised: 07/20/2016 Document Reviewed: 07/20/2016 Elsevier Interactive Patient Education  2018 [employer].     IF you received an x-ray today, you will receive an invoice from [facility]. Please contact [facility] at [phone] with questions or concerns regarding your invoice.   IF you received labwork today, you will receive an invoice  from [facility]. Please contact [facility] at [phone] with questions or concerns regarding your invoice.   Our billing staff will not be able to assist you with questions regarding bills from these companies.  You will be contacted with the lab results as soon as they are available. The fastest way to get your results is to activate your My Chart account. Instructions are located on the last page of this paperwork. If you have not heard from [place] regarding the results in 2 weeks, please contact this office.

## 2018-03-08 LAB — MICROALBUMIN / CREATININE URINE RATIO
CREATININE, UR: 200.8 mg/dL
MICROALBUM., U, RANDOM: 11.4 ug/mL
Microalb/Creat Ratio: 5.7 mg/g creat (ref 0.0–30.0)

## 2018-03-15 ENCOUNTER — Ambulatory Visit: Payer: Self-pay | Admitting: *Deleted

## 2018-03-15 ENCOUNTER — Encounter (HOSPITAL_COMMUNITY): Payer: Self-pay

## 2018-03-15 ENCOUNTER — Other Ambulatory Visit: Payer: Self-pay

## 2018-03-15 ENCOUNTER — Emergency Department (HOSPITAL_COMMUNITY)
Admission: EM | Admit: 2018-03-15 | Discharge: 2018-03-15 | Disposition: A | Discharge status: Home / Self Care | Payer: Medicare HMO | Attending: Emergency Medicine | Admitting: Emergency Medicine

## 2018-03-15 DIAGNOSIS — R21 Rash and other nonspecific skin eruption: Secondary | ICD-10-CM | POA: Diagnosis present

## 2018-03-15 DIAGNOSIS — F17210 Nicotine dependence, cigarettes, uncomplicated: Secondary | ICD-10-CM | POA: Insufficient documentation

## 2018-03-15 DIAGNOSIS — L509 Urticaria, unspecified: Secondary | ICD-10-CM | POA: Diagnosis not present

## 2018-03-15 DIAGNOSIS — Z79899 Other long term (current) drug therapy: Secondary | ICD-10-CM | POA: Diagnosis not present

## 2018-03-15 DIAGNOSIS — R69 Illness, unspecified: Secondary | ICD-10-CM | POA: Diagnosis not present

## 2018-03-15 MED ORDER — FAMOTIDINE 20 MG PO TABS: 20.0000 mg | ORAL_TABLET | Freq: Two times a day (BID) | ORAL | 0 refills | Status: DC

## 2018-03-15 MED ORDER — DIPHENHYDRAMINE HCL 25 MG PO TABS: 25.0000 mg | ORAL_TABLET | Freq: Four times a day (QID) | ORAL | 0 refills | Status: DC

## 2018-03-15 MED ORDER — PREDNISONE 20 MG PO TABS: ORAL_TABLET | ORAL | 0 refills | Status: DC

## 2018-03-15 NOTE — Telephone Encounter (Signed)
Message sent to [facility] - re: ed visit for rash

## 2018-03-15 NOTE — ED Notes (Signed)
Declined W/C at D/C and was escorted to lobby by RN. 

## 2018-03-15 NOTE — Telephone Encounter (Signed)
Pt calling with assistance of translator 'Don.'  Reports generalized rash "All over body" severe itching, onset midnight while at work. Also reports "Swelling all over." Does deny any tongue, throat swelling; speech clear. Difficult assessment even with translator present. Pt directed to ED, initially refused disposition. Spoke with Larene Beach at practice who noted pt was at ED presently.  Reason for Disposition . Face becomes swollen    "Swelling all over."  Answer Assessment - Initial Assessment Questions 1. APPEARANCE of RASH: "Describe the rash." (e.g., spots, blisters, raised areas, skin peeling, scaly)     Red 2. SIZE: "How big are the spots?" (e.g., tip of pen, eraser, coin; inches, centimeters)     Diffuse 3. LOCATION: "Where is the rash located?"     "All over" 4. COLOR: "What color is the rash?" (Note: It is difficult to assess rash color in people with darker-colored skin. When this situation occurs, simply ask the caller to describe what they see.)     Red 5. ONSET: "When did the rash begin?"     Midnight at work 6. FEVER: "Do you have a fever?" If so, ask: "What is your temperature, how was it measured, and when did it start?"      7. ITCHING: "Does the rash itch?" If so, ask: "How bad is the itch?" (Scale 1-10; or mild, moderate, severe)     severe 8. CAUSE: "What do you think is causing the rash?"     unsure 9. MEDICATION FACTORS: "Have you started any new medications within the last 2 weeks?" (e.g., antibiotics)       10. OTHER SYMPTOMS: "Do you have any other symptoms?" (e.g., dizziness, headache, sore throat, joint pain)       "Swelling all over body"  Protocols used: RASH OR REDNESS - [facility]

## 2018-03-15 NOTE — ED Triage Notes (Signed)
Pt presents for evaluation of generalized red rash that developed last night. Reports itching. Denies recent illness or exposure.
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
## 2018-03-15 NOTE — Discharge Instructions (Addendum)
Please discontinue taking Lisinopril and discuss this with your doctor next week as it may be the cause of your allergic reaction.  Return if your condition worsen or if you have other concerns.

## 2018-03-19 ENCOUNTER — Ambulatory Visit (AMBULATORY_SURGERY_CENTER): Payer: Medicare HMO | Admitting: Gastroenterology

## 2018-03-19 ENCOUNTER — Encounter: Payer: Self-pay | Admitting: Gastroenterology

## 2018-03-19 VITALS — BP 130/82 | HR 68 | Temp 99.3°F | Resp 16 | Ht 67.0 in | Wt 186.0 lb

## 2018-03-19 DIAGNOSIS — Z1211 Encounter for screening for malignant neoplasm of colon: Secondary | ICD-10-CM | POA: Diagnosis not present

## 2018-03-19 DIAGNOSIS — K2970 Gastritis, unspecified, without bleeding: Secondary | ICD-10-CM

## 2018-03-19 DIAGNOSIS — K2950 Unspecified chronic gastritis without bleeding: Secondary | ICD-10-CM | POA: Diagnosis not present

## 2018-03-19 DIAGNOSIS — K219 Gastro-esophageal reflux disease without esophagitis: Secondary | ICD-10-CM | POA: Diagnosis not present

## 2018-03-19 DIAGNOSIS — K635 Polyp of colon: Secondary | ICD-10-CM

## 2018-03-19 DIAGNOSIS — K5900 Constipation, unspecified: Secondary | ICD-10-CM | POA: Diagnosis not present

## 2018-03-19 DIAGNOSIS — D125 Benign neoplasm of sigmoid colon: Secondary | ICD-10-CM

## 2018-03-19 DIAGNOSIS — B9681 Helicobacter pylori [H. pylori] as the cause of diseases classified elsewhere: Secondary | ICD-10-CM | POA: Diagnosis not present

## 2018-03-19 DIAGNOSIS — I10 Essential (primary) hypertension: Secondary | ICD-10-CM | POA: Diagnosis not present

## 2018-03-19 DIAGNOSIS — R109 Unspecified abdominal pain: Secondary | ICD-10-CM | POA: Diagnosis not present

## 2018-03-19 MED ORDER — SODIUM CHLORIDE 0.9 % IV SOLN: 500.0000 mL | Freq: Once | INTRAVENOUS | Status: DC

## 2018-03-19 NOTE — Progress Notes (Signed)
Called to room to assist during endoscopic procedure.  Patient ID and intended procedure confirmed with present staff. Received instructions for my participation in the procedure from the performing physician.  

## 2018-03-19 NOTE — Progress Notes (Signed)
[place] interpreter, Marva Panda,  present during recovery and discharge instructions.  Son ws also present, he speaks [place]. AVS printed in [place] and [place]

## 2018-03-19 NOTE — Progress Notes (Signed)
Report given to PACU, vss 

## 2018-03-19 NOTE — Op Note (Signed)
[place] Patient Name: Bobby [name] Procedure Date: 03/19/2018 2:09 PM MRN: [id number] Endoscopist: Milus Banister , MD Age: 67 Referring MD:  Date of Birth: [birth year] Gender: Male Account #: [id number] Procedure:                Colonoscopy Indications:              Constipation Medicines:                Monitored Anesthesia Care Procedure:                Pre-Anesthesia Assessment:                           - Prior to the procedure, a History and Physical                            was performed, and patient medications and                            allergies were reviewed. The patient's tolerance of                            previous anesthesia was also reviewed. The risks                            and benefits of the procedure and the sedation                            options and risks were discussed with the patient.                            All questions were answered, and informed consent                            was obtained. Prior Anticoagulants: The patient has                            taken no previous anticoagulant or antiplatelet                            agents. ASA Grade Assessment: II - A patient with                            mild systemic disease. After reviewing the risks                            and benefits, the patient was deemed in                            satisfactory condition to undergo the procedure.                           After obtaining informed consent, the colonoscope  was passed under direct vision. Throughout the                            procedure, the patient's blood pressure, pulse, and                            oxygen saturations were monitored continuously. The                            Model PCF-H190DL [id number]) scope was introduced                            through the anus and advanced to the the cecum,                            identified by appendiceal orifice and ileocecal                    valve. The colonoscopy was performed without                            difficulty. The patient tolerated the procedure                            well. The quality of the bowel preparation was                            good. The ileocecal valve, appendiceal orifice, and                            rectum were photographed. Scope In: 2:33:13 PM Scope Out: 2:42:33 PM Scope Withdrawal Time: 0 hours 7 minutes 37 seconds  Total Procedure Duration: 0 hours 9 minutes 20 seconds  Findings:                 A 3 mm polyp was found in the sigmoid colon. The                            polyp was sessile. The polyp was removed with a                            cold snare. Resection and retrieval were complete.                           The exam was otherwise without abnormality on                            direct and retroflexion views. Complications:            No immediate complications. Estimated blood loss:                            None. Estimated Blood Loss:     Estimated blood loss: none. Impression:               - One 3 mm polyp  in the sigmoid colon, removed with                            a cold snare. Resected and retrieved.                           - The examination was otherwise normal on direct                            and retroflexion views. Recommendation:           - Patient has a contact number available for                            emergencies. The signs and symptoms of potential                            delayed complications were discussed with the                            patient. Return to normal activities tomorrow.                            Written discharge instructions were provided to the                            patient.                           - Resume previous diet.                           - Continue present medications.                           You will receive a letter within 2-3 weeks with the                            pathology  results and my final recommendations.                           If the polyp(s) is proven to be 'pre-cancerous' on                            pathology, you will need repeat colonoscopy in 5                            years. If the polyp(s) is NOT 'precancerous' on                            pathology then you should repeat colon cancer                            screening in 10 years with colonoscopy without need  for colon cancer screening by any method prior to                            then (including stool testing). Milus Banister, MD 03/19/2018 2:44:20 PM This report has been signed electronically.

## 2018-03-19 NOTE — Op Note (Signed)
[place] Patient Name: Bobby [name] Procedure Date: 03/19/2018 2:08 PM MRN: [id number] Endoscopist: Milus Banister , MD Age: 67 Referring MD:  Date of Birth: Apr 05, 1951 Gender: Male Account #: [id number] Procedure:                Upper GI endoscopy Indications:              Abdominal pain in the right side Medicines:                Monitored Anesthesia Care Procedure:                Pre-Anesthesia Assessment:                           - Prior to the procedure, a History and Physical                            was performed, and patient medications and                            allergies were reviewed. The patient's tolerance of                            previous anesthesia was also reviewed. The risks                            and benefits of the procedure and the sedation                            options and risks were discussed with the patient.                            All questions were answered, and informed consent                            was obtained. Prior Anticoagulants: The patient has                            taken no previous anticoagulant or antiplatelet                            agents. ASA Grade Assessment: II - A patient with                            mild systemic disease. After reviewing the risks                            and benefits, the patient was deemed in                            satisfactory condition to undergo the procedure.                           After obtaining informed consent, the endoscope was  passed under direct vision. Throughout the                            procedure, the patient's blood pressure, pulse, and                            oxygen saturations were monitored continuously. The                            Model GIF-[id number]) scope was introduced                            through the mouth, and advanced to the second part                            of duodenum. The upper GI  endoscopy was                            accomplished without difficulty. The patient                            tolerated the procedure well. Scope In: Scope Out: Findings:                 Mild inflammation characterized by erosions,                            erythema and granularity was found in the gastric                            antrum and body. Biopsies were taken with a cold                            forceps for histology.                           The exam was otherwise without abnormality. Complications:            No immediate complications. Estimated blood loss:                            None. Estimated Blood Loss:     Estimated blood loss: none. Impression:               - Mild gastritis, biopsied.                           - The examination was otherwise normal. Recommendation:           - Patient has a contact number available for                            emergencies. The signs and symptoms of potential                            delayed complications were discussed with the  patient. Return to normal activities tomorrow.                            Written discharge instructions were provided to the                            patient.                           - Resume previous diet.                           - Continue present medications.                           - Await pathology results. Milus Banister, MD 03/19/2018 2:51:02 PM This report has been signed electronically.

## 2018-03-19 NOTE — Progress Notes (Signed)
Pt's states no medical or surgical changes since previsit or office visit. 

## 2018-03-19 NOTE — Patient Instructions (Addendum)
YOU HAD AN ENDOSCOPIC PROCEDURE TODAY AT THE [facility]:   Refer to the procedure report that was given to you for any specific questions about what was found during the examination.  If the procedure report does not answer your questions, please call your gastroenterologist to clarify.  If you requested that your care partner not be given the details of your procedure findings, then the procedure report has been included in a sealed envelope for you to review at your convenience later.  YOU SHOULD EXPECT: Some feelings of bloating in the abdomen. Passage of more gas than usual.  Walking can help get rid of the air that was put into your GI tract during the procedure and reduce the bloating. If you had a lower endoscopy (such as a colonoscopy or flexible sigmoidoscopy) you may notice spotting of blood in your stool or on the toilet paper. If you underwent a bowel prep for your procedure, you may not have a normal bowel movement for a few days.  Please Note:  You might notice some irritation and congestion in your nose or some drainage.  This is from the oxygen used during your procedure.  There is no need for concern and it should clear up in a day or so.  SYMPTOMS TO REPORT IMMEDIATELY:   Following lower endoscopy (colonoscopy or flexible sigmoidoscopy):  Excessive amounts of blood in the stool  Significant tenderness or worsening of abdominal pains  Swelling of the abdomen that is new, acute  Fever of 100F or higher   Following upper endoscopy (EGD)  Vomiting of blood or coffee ground material  New chest pain or pain under the shoulder blades  Painful or persistently difficult swallowing  New shortness of breath  Fever of 100F or higher  Black, tarry-looking stools  For urgent or emergent issues, a gastroenterologist can be reached at any hour by calling [phone].   DIET:  We do recommend a small meal at first, but then you may proceed to your regular diet.  Drink  plenty of fluids but you should avoid alcoholic beverages for 24 hours.  ACTIVITY:  You should plan to take it easy for the rest of today and you should NOT DRIVE or use heavy machinery until tomorrow (because of the sedation medicines used during the test).    FOLLOW UP: Our staff will call the number listed on your records the next business day following your procedure to check on you and address any questions or concerns that you may have regarding the information given to you following your procedure. If we do not reach you, we will leave a message.  However, if you are feeling well and you are not experiencing any problems, there is no need to return our call.  We will assume that you have returned to your regular daily activities without incident.  If any biopsies were taken you will be contacted by phone or by letter within the next 1-3 weeks.  Please call us at [phone] if you have not heard about the biopsies in 3 weeks.    SIGNATURES/CONFIDENTIALITY: You and/or your care partner have signed paperwork which will be entered into your electronic medical record.  These signatures attest to the fact that that the information above on your After Visit Summary has been reviewed and is understood.  Full responsibility of the confidentiality of this discharge information lies with you and/or your care-partner. 

## 2018-03-20 ENCOUNTER — Telehealth: Payer: Self-pay

## 2018-03-20 NOTE — Telephone Encounter (Signed)
  Follow up Call-  Call back number 03/19/2018  Post procedure Call Back phone  # [phone]  Permission to leave phone message Yes  Some recent data might be hidden     Patient questions:  Do you have a fever, pain , or abdominal swelling? No. Pain Score  0 *  Have you tolerated food without any problems? Yes.    Have you been able to return to your normal activities? Yes.    Do you have any questions about your discharge instructions: Diet   No. Medications  No. Follow up visit  No.  Do you have questions or concerns about your Care? No.  Actions: * If pain score is 4 or above: No action needed, pain <4.

## 2018-03-23 LAB — COLOGUARD: Cologuard: NEGATIVE

## 2018-03-29 ENCOUNTER — Other Ambulatory Visit: Payer: Self-pay | Admitting: Urgent Care

## 2018-04-01 NOTE — Telephone Encounter (Signed)
LOV 03/07/2018 with Jaynee Eagles / Refill request for Azelastine drops / Will send to provider for review

## 2018-04-02 ENCOUNTER — Other Ambulatory Visit: Payer: Self-pay

## 2018-04-02 MED ORDER — BIS SUBCIT-METRONID-TETRACYC 140-125-125 MG PO CAPS: 3.0000 | ORAL_CAPSULE | Freq: Three times a day (TID) | ORAL | 0 refills | Status: DC

## 2018-04-02 MED ORDER — OMEPRAZOLE 20 MG PO CPDR: 20.0000 mg | DELAYED_RELEASE_CAPSULE | Freq: Two times a day (BID) | ORAL | 0 refills | Status: DC

## 2018-05-05 ENCOUNTER — Other Ambulatory Visit: Payer: Self-pay | Admitting: Gastroenterology

## 2018-05-30 ENCOUNTER — Ambulatory Visit (INDEPENDENT_AMBULATORY_CARE_PROVIDER_SITE_OTHER): Payer: Medicare HMO | Admitting: Urgent Care

## 2018-05-30 ENCOUNTER — Other Ambulatory Visit: Payer: Self-pay

## 2018-05-30 ENCOUNTER — Encounter: Payer: Self-pay | Admitting: Urgent Care

## 2018-05-30 VITALS — BP 134/75 | HR 70 | Temp 97.6°F | Resp 17 | Ht 67.0 in | Wt 185.0 lb

## 2018-05-30 DIAGNOSIS — N401 Enlarged prostate with lower urinary tract symptoms: Secondary | ICD-10-CM

## 2018-05-30 DIAGNOSIS — R3916 Straining to void: Secondary | ICD-10-CM | POA: Diagnosis not present

## 2018-05-30 DIAGNOSIS — Z23 Encounter for immunization: Secondary | ICD-10-CM

## 2018-05-30 DIAGNOSIS — R3912 Poor urinary stream: Secondary | ICD-10-CM | POA: Diagnosis not present

## 2018-05-30 DIAGNOSIS — R300 Dysuria: Secondary | ICD-10-CM | POA: Diagnosis not present

## 2018-05-30 DIAGNOSIS — I10 Essential (primary) hypertension: Secondary | ICD-10-CM | POA: Diagnosis not present

## 2018-05-30 LAB — POCT URINALYSIS DIP (MANUAL ENTRY)
Bilirubin, UA: NEGATIVE
Glucose, UA: NEGATIVE mg/dL
Ketones, POC UA: NEGATIVE mg/dL
Leukocytes, UA: NEGATIVE
Nitrite, UA: NEGATIVE
PH UA: 6 (ref 5.0–8.0)
PROTEIN UA: NEGATIVE mg/dL
RBC UA: NEGATIVE
SPEC GRAV UA: 1.02 (ref 1.010–1.025)
UROBILINOGEN UA: 0.2 U/dL

## 2018-05-30 MED ORDER — LISINOPRIL 5 MG PO TABS: 5.0000 mg | ORAL_TABLET | Freq: Every day | ORAL | 3 refills | Status: DC

## 2018-05-30 MED ORDER — AMLODIPINE BESYLATE 5 MG PO TABS: 5.0000 mg | ORAL_TABLET | Freq: Every day | ORAL | 3 refills | Status: DC
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
## 2018-05-30 NOTE — Patient Instructions (Addendum)
Hypertension Hypertension, commonly called high blood pressure, is when the force of blood pumping through the arteries is too strong. The arteries are the blood vessels that carry blood from the heart throughout the body. Hypertension forces the heart to work harder to pump blood and may cause arteries to become narrow or stiff. Having untreated or uncontrolled hypertension can cause heart attacks, strokes, kidney disease, and other problems. A blood pressure reading consists of a higher number over a lower number. Ideally, your blood pressure should be below 120/80. The first ("top") number is called the systolic pressure. It is a measure of the pressure in your arteries as your heart beats. The second ("bottom") number is called the diastolic pressure. It is a measure of the pressure in your arteries as the heart relaxes. What are the causes? The cause of this condition is not known. What increases the risk? Some risk factors for high blood pressure are under your control. Others are not. Factors you can change  Smoking.  Having type 2 diabetes mellitus, high cholesterol, or both.  Not getting enough exercise or physical activity.  Being overweight.  Having too much fat, sugar, calories, or salt (sodium) in your diet.  Drinking too much alcohol. Factors that are difficult or impossible to change  Having chronic kidney disease.  Having a family history of high blood pressure.  Age. Risk increases with age.  Race. You may be at higher risk if you are African-American.  Gender. Men are at higher risk than women before age 45. After age 65, women are at higher risk than men.  Having obstructive sleep apnea.  Stress. What are the signs or symptoms? Extremely high blood pressure (hypertensive crisis) may cause:  Headache.  Anxiety.  Shortness of breath.  Nosebleed.  Nausea and vomiting.  Severe chest pain.  Jerky movements you cannot control (seizures).  How is this  diagnosed? This condition is diagnosed by measuring your blood pressure while you are seated, with your arm resting on a surface. The cuff of the blood pressure monitor will be placed directly against the skin of your upper arm at the level of your heart. It should be measured at least twice using the same arm. Certain conditions can cause a difference in blood pressure between your right and left arms. Certain factors can cause blood pressure readings to be lower or higher than normal (elevated) for a short period of time:  When your blood pressure is higher when you are in a health care provider's office than when you are at home, this is called white coat hypertension. Most people with this condition do not need medicines.  When your blood pressure is higher at home than when you are in a health care provider's office, this is called masked hypertension. Most people with this condition may need medicines to control blood pressure.  If you have a high blood pressure reading during one visit or you have normal blood pressure with other risk factors:  You may be asked to return on a different day to have your blood pressure checked again.  You may be asked to monitor your blood pressure at home for 1 week or longer.  If you are diagnosed with hypertension, you may have other blood or imaging tests to help your health care provider understand your overall risk for other conditions. How is this treated? This condition is treated by making healthy lifestyle changes, such as eating healthy foods, exercising more, and reducing your alcohol intake. Your   health care provider may prescribe medicine if lifestyle changes are not enough to get your blood pressure under control, and if:  Your systolic blood pressure is above 130.  Your diastolic blood pressure is above 80.  Your personal target blood pressure may vary depending on your medical conditions, your age, and other factors. Follow these  instructions at home: Eating and drinking  Eat a diet that is high in fiber and potassium, and low in sodium, added sugar, and fat. An example eating plan is called the DASH (Dietary Approaches to Stop Hypertension) diet. To eat this way: ? Eat plenty of fresh fruits and vegetables. Try to fill half of your plate at each meal with fruits and vegetables. ? Eat whole grains, such as whole wheat pasta, brown rice, or whole grain bread. Fill about one quarter of your plate with whole grains. ? Eat or drink low-fat dairy products, such as skim milk or low-fat yogurt. ? Avoid fatty cuts of meat, processed or cured meats, and poultry with skin. Fill about one quarter of your plate with lean proteins, such as fish, chicken without skin, beans, eggs, and tofu. ? Avoid premade and processed foods. These tend to be higher in sodium, added sugar, and fat.  Reduce your daily sodium intake. Most people with hypertension should eat less than 1,500 mg of sodium a day.  Limit alcohol intake to no more than 1 drink a day for nonpregnant women and 2 drinks a day for men. One drink equals 12 oz of beer, 5 oz of wine, or 1 oz of hard liquor. Lifestyle  Work with your health care provider to maintain a healthy body weight or to lose weight. Ask what an ideal weight is for you.  Get at least 30 minutes of exercise that causes your heart to beat faster (aerobic exercise) most days of the week. Activities may include walking, swimming, or biking.  Include exercise to strengthen your muscles (resistance exercise), such as pilates or lifting weights, as part of your weekly exercise routine. Try to do these types of exercises for 30 minutes at least 3 days a week.  Do not use any products that contain nicotine or tobacco, such as cigarettes and e-cigarettes. If you need help quitting, ask your health care provider.  Monitor your blood pressure at home as told by your health care provider.  Keep all follow-up visits as  told by your health care provider. This is important. Medicines  Take over-the-counter and prescription medicines only as told by your health care provider. Follow directions carefully. Blood pressure medicines must be taken as prescribed.  Do not skip doses of blood pressure medicine. Doing this puts you at risk for problems and can make the medicine less effective.  Ask your health care provider about side effects or reactions to medicines that you should watch for. Contact a health care provider if:  You think you are having a reaction to a medicine you are taking.  You have headaches that keep coming back (recurring).  You feel dizzy.  You have swelling in your ankles.  You have trouble with your vision. Get help right away if:  You develop a severe headache or confusion.  You have unusual weakness or numbness.  You feel faint.  You have severe pain in your chest or abdomen.  You vomit repeatedly.  You have trouble breathing. Summary  Hypertension is when the force of blood pumping through your arteries is too strong. If this condition is not   controlled, it may put you at risk for serious complications.  Your personal target blood pressure may vary depending on your medical conditions, your age, and other factors. For most people, a normal blood pressure is less than 120/80.  Hypertension is treated with lifestyle changes, medicines, or a combination of both. Lifestyle changes include weight loss, eating a healthy, low-sodium diet, exercising more, and limiting alcohol. This information is not intended to replace advice given to you by your health care provider. Make sure you discuss any questions you have with your health care provider. Document Released: 08/22/2005 Document Revised: 07/20/2016 Document Reviewed: 07/20/2016 Elsevier Interactive Patient Education  [employer].     If you have lab work done today you will be contacted with your lab results  within the next 2 weeks.  If you have not heard from [place] then please contact us. The fastest way to get your results is to register for My Chart.   IF you received an x-ray today, you will receive an invoice from [facility]. Please contact [facility] at [phone] with questions or concerns regarding your invoice.   IF you received labwork today, you will receive an invoice from [facility]. Please contact [facility] at [phone] with questions or concerns regarding your invoice.   Our billing staff will not be able to assist you with questions regarding bills from these companies.  You will be contacted with the lab results as soon as they are available. The fastest way to get your results is to activate your My Chart account. Instructions are located on the last page of this paperwork. If you have not heard from [place] regarding the results in 2 weeks, please contact this office.

## 2018-05-31 LAB — URINE CULTURE: Organism ID, Bacteria: NO GROWTH

## 2018-05-31 LAB — CBC WITH DIFFERENTIAL/PLATELET
Basophils Absolute: 0 10*3/uL (ref 0.0–0.2)
Basos: 0 %
EOS (ABSOLUTE): 0.1 10*3/uL (ref 0.0–0.4)
Eos: 1 %
Hematocrit: 37.7 % (ref 37.5–51.0)
Hemoglobin: 12 g/dL — ABNORMAL LOW (ref 13.0–17.7)
IMMATURE GRANULOCYTES: 1 %
Immature Grans (Abs): 0 10*3/uL (ref 0.0–0.1)
LYMPHS ABS: 1.2 10*3/uL (ref 0.7–3.1)
Lymphs: 21 %
MCH: 20.5 pg — AB (ref 26.6–33.0)
MCHC: 31.8 g/dL (ref 31.5–35.7)
MCV: 65 fL — AB (ref 79–97)
MONOS ABS: 0.4 10*3/uL (ref 0.1–0.9)
Monocytes: 7 %
Neutrophils Absolute: 4.1 10*3/uL (ref 1.4–7.0)
Neutrophils: 70 %
PLATELETS: 166 10*3/uL (ref 150–450)
RBC: 5.84 x10E6/uL — ABNORMAL HIGH (ref 4.14–5.80)
RDW: 19.2 % — AB (ref 12.3–15.4)
WBC: 5.8 10*3/uL (ref 3.4–10.8)

## 2018-05-31 LAB — BASIC METABOLIC PANEL
BUN/Creatinine Ratio: 19 (ref 10–24)
BUN: 17 mg/dL (ref 8–27)
CHLORIDE: 101 mmol/L (ref 96–106)
CO2: 23 mmol/L (ref 20–29)
Calcium: 9.4 mg/dL (ref 8.6–10.2)
Creatinine, Ser: 0.91 mg/dL (ref 0.76–1.27)
GFR, EST AFRICAN AMERICAN: 101 mL/min/{1.73_m2} (ref >59)
GFR, EST NON AFRICAN AMERICAN: 88 mL/min/{1.73_m2} (ref >59)
Glucose: 96 mg/dL (ref 65–99)
POTASSIUM: 4.4 mmol/L (ref 3.5–5.2)
SODIUM: 141 mmol/L (ref 134–144)

## 2018-06-01 ENCOUNTER — Other Ambulatory Visit: Payer: Self-pay | Admitting: Urgent Care

## 2018-06-01 DIAGNOSIS — D649 Anemia, unspecified: Secondary | ICD-10-CM

## 2018-06-02 ENCOUNTER — Other Ambulatory Visit: Payer: Self-pay | Admitting: Gastroenterology

## 2018-06-05 ENCOUNTER — Encounter: Payer: Self-pay | Admitting: *Deleted

## 2018-06-06 ENCOUNTER — Encounter: Payer: Self-pay | Admitting: Hematology and Oncology

## 2018-06-06 ENCOUNTER — Telehealth: Payer: Self-pay | Admitting: Hematology and Oncology

## 2018-06-06 NOTE — Telephone Encounter (Signed)
Pt cld to reschedule appt to see Dr. Lindi Adie on 10/31 at [place]. Letter mailed.

## 2018-06-06 NOTE — Telephone Encounter (Signed)
New appt has been scheduled for the Bobby [name] to see Dr. Audelia Hives on 10/14 at 11am. Letter mailed to the Bobby [name] and msg fwd to referring to notify the Bobby [name].

## 2018-06-07 ENCOUNTER — Ambulatory Visit: Payer: Self-pay | Admitting: Urgent Care

## 2018-06-18 ENCOUNTER — Encounter: Payer: Medicare HMO | Admitting: Hematology and Oncology

## 2018-06-24 ENCOUNTER — Other Ambulatory Visit: Payer: Self-pay | Admitting: Gastroenterology

## 2018-07-05 ENCOUNTER — Inpatient Hospital Stay: Payer: Medicare HMO

## 2018-07-05 ENCOUNTER — Inpatient Hospital Stay: Payer: Medicare HMO | Attending: Hematology and Oncology | Admitting: Hematology and Oncology

## 2018-07-05 DIAGNOSIS — R718 Other abnormality of red blood cells: Secondary | ICD-10-CM | POA: Insufficient documentation

## 2018-07-05 NOTE — Assessment & Plan Note (Signed)
Microcytosis: With relatively normal hemoglobin levels. 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 We will send for hemoglobin electrophoresis.  I explained to the patient what thalassemia means.  I explained to him that beta thalassemia can have 3 variants minor intermedia and major.  Return to clinic in 2 weeks to discuss the lab results
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
## 2018-07-09 LAB — HEMOGLOBINOPATHY EVALUATION
HGB A2 QUANT: 5.6 % — AB (ref 1.8–3.2)
HGB A: 0 % — AB (ref 96.4–98.8)
HGB F QUANT: 2.1 % — AB (ref 0.0–2.0)
Hgb C: 0 %
Hgb S Quant: 0 %
Hgb Variant: 92.3 % — ABNORMAL HIGH

## 2018-07-18 ENCOUNTER — Telehealth: Payer: Self-pay | Admitting: Hematology and Oncology

## 2018-07-18 NOTE — Telephone Encounter (Signed)
VG out 11/15 - moved f/u to 11/21 - date/time per desk nurse. Spoke with patient - patient speaks English also.

## 2018-07-20 ENCOUNTER — Inpatient Hospital Stay: Payer: Medicare HMO | Admitting: Hematology and Oncology

## 2018-07-24 NOTE — Progress Notes (Signed)
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 with above-mentioned recent history of Microcytois. 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  He presents to the clinic today with his interpreter. He reports dizziness upon standing and bending his back, which is attributable to blood pressure meds. He notes whole body pain and aches, centralized in his lower back, which can be attributed to strain from lifting heavy objects. The patient expressed worry of the need to take medication for his condition, but now understands his condition.   REVIEW OF SYSTEMS:   Constitutional: Denies fevers, chills or abnormal weight loss (+) dizziness upon standing Eyes: Denies blurriness of vision Ears, nose, mouth, throat, and face: Denies mucositis or sore throat Respiratory: Denies cough, dyspnea or wheezes Cardiovascular: Denies palpitation, chest discomfort Gastrointestinal:  Denies nausea, heartburn or change in bowel habits Skin: Denies abnormal skin rashes MSK: (+) aches and pains in lower back, intermittent Lymphatics: Denies new lymphadenopathy or easy bruising Neurological:Denies numbness, tingling or new weaknesses Behavioral/Psych: Mood is stable, no new changes  Extremities: No lower extremity edema All other systems were reviewed with the patient and are negative.  I have reviewed the past medical history, past surgical history, social history and family history with the patient and they are unchanged from previous note.  ALLERGIES:  has no allergies on file.  MEDICATIONS:  Current Outpatient Medications  Medication Sig Dispense Refill  . amLODipine (NORVASC) 5 MG tablet Take 1 tablet (5 mg total) by mouth daily. 90 tablet 3  . bismuth-metronidazole-tetracycline (PYLERA) 140-125-125  MG capsule Take 3 capsules by mouth 4 (four) times daily -  before meals and at bedtime for 14 days. 168 capsule 0  . diphenhydrAMINE (BENADRYL) 25 MG tablet Take 1 tablet (25 mg total) by mouth every 6 (six) hours. (Patient not taking: Reported on 05/30/2018) 20 tablet 0  . famotidine (PEPCID) 20 MG tablet Take 1 tablet (20 mg total) by mouth 2 (two) times daily. (Patient not taking: Reported on 05/30/2018) 10 tablet 0  . finasteride (PROSCAR) 5 MG tablet Take 5 mg by mouth daily.    Marland Kitchen lisinopril (PRINIVIL,ZESTRIL) 5 MG tablet Take 1 tablet (5 mg total) by mouth daily. 90 tablet 3  . omeprazole (PRILOSEC) 20 MG capsule TAKE 1 CAPSULE (20 MG TOTAL) BY MOUTH 2 (TWO) TIMES DAILY BEFORE A MEAL. 28 capsule 0  . tamsulosin (FLOMAX) 0.4 MG CAPS capsule Take 0.4 mg by mouth 2 (two) times daily.     Current Facility-Administered Medications  Medication Dose Route Frequency Provider Last Rate Last Dose  . 0.9 %  sodium chloride infusion  500 mL Intravenous Once Milus Banister, MD        PHYSICAL EXAMINATION: ECOG PERFORMANCE STATUS: 1 - Symptomatic but completely ambulatory  Vitals:   07/26/18 0943  BP: (!) 142/78  Pulse: 76  Resp: 18  Temp: 97.7 F (36.5 C)  SpO2: 100%   Filed Weights   07/26/18 0943  Weight: 187 lb 9.6 oz (85.1 kg)    GENERAL:alert, no distress and comfortable SKIN: skin color, texture, turgor are normal, no rashes or significant lesions EYES: normal, Conjunctiva are pink and non-injected, sclera clear OROPHARYNX:no exudate, no erythema and lips, buccal mucosa, and tongue normal  NECK: supple, thyroid normal size, non-tender, without nodularity LYMPH:  no palpable lymphadenopathy in the cervical, axillary or inguinal LUNGS: clear to auscultation and percussion with normal breathing effort HEART: regular rate & rhythm and no murmurs and no lower extremity edema ABDOMEN:abdomen soft, non-tender and normal bowel sounds MUSCULOSKELETAL:no cyanosis of digits and no  clubbing  NEURO: alert & oriented x 3 with fluent speech, no focal motor/sensory deficits EXTREMITIES: No lower extremity edema  LABORATORY DATA:  I have reviewed the data as listed CMP Latest Ref Rng & Units 05/30/2018 01/08/2018 11/30/2017  Glucose 65 - 99 mg/dL 96 101(H) 81  BUN 8 - 27 mg/dL 17 16 18   Creatinine 0.76 - 1.27 mg/dL 0.91 0.96 1.11  Sodium 134 - 144 mmol/L 141 140 142  Potassium 3.5 - 5.2 mmol/L 4.4 4.5 4.1  Chloride 96 - 106 mmol/L 101 105 105  CO2 20 - 29 mmol/L 23 28 22   Calcium 8.6 - 10.2 mg/dL 9.4 9.1 9.0  Total Protein 6.0 - 8.5 g/dL - - 7.5  Total Bilirubin 0.0 - 1.2 mg/dL - - 0.8  Alkaline Phos 39 - 117 IU/L - - 67  AST 0 - 40 IU/L - - 23  ALT 0 - 44 IU/L - - 29    Lab Results  Component Value Date   WBC 5.8 05/30/2018   HGB 12.0 (L) 05/30/2018   HCT 37.7 05/30/2018   MCV 65 (L) 05/30/2018   PLT 166 05/30/2018   NEUTROABS 4.1 05/30/2018    ASSESSMENT & PLAN:  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  Homozygotes (hemoglobin E disease, HbEE) have minimal anemia along with hypochromia, target cells, and prominent microcytosis. 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  Hemoglobin E does not need any treatment. I provided literature on hemoglobin E I informed the patient that if anyone says the patient has iron deficiency to let them know that he has hemoglobin E which makes the Red cell microcytic.  Return to clinic on an as-needed basis    No orders of the defined types were placed in this encounter.  The patient has a good understanding of the overall plan. he agrees with it. he will call with any problems that may develop before the next visit  here.   Molly L Dorshimer 07/26/2018    I, Molly Dorshimer, am acting as Education administrator for Nicholas Lose, MD.  I have reviewed the above documentation for accuracy and completeness, and I agree with the above.

## 2018-07-26 ENCOUNTER — Inpatient Hospital Stay: Payer: Medicare HMO | Attending: Hematology and Oncology | Admitting: Hematology and Oncology

## 2018-07-26 ENCOUNTER — Telehealth: Payer: Self-pay | Admitting: Hematology and Oncology

## 2018-07-26 DIAGNOSIS — D649 Anemia, unspecified: Secondary | ICD-10-CM | POA: Diagnosis not present

## 2018-07-26 DIAGNOSIS — D582 Other hemoglobinopathies: Secondary | ICD-10-CM | POA: Insufficient documentation

## 2018-07-26 DIAGNOSIS — Z79899 Other long term (current) drug therapy: Secondary | ICD-10-CM | POA: Diagnosis not present

## 2018-07-26 DIAGNOSIS — D565 Hemoglobin E-beta thalassemia: Secondary | ICD-10-CM | POA: Diagnosis not present

## 2018-07-26 NOTE — Telephone Encounter (Signed)
No 11/21 los or referrals.

## 2018-07-26 NOTE — Assessment & Plan Note (Signed)
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  Homozygotes (hemoglobin E disease, HbEE) have minimal anemia along with hypochromia, target cells, and prominent microcytosis. 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  Hemoglobin E does not need any treatment.  Return to clinic on an as-needed basis

## 2018-07-28 ENCOUNTER — Other Ambulatory Visit: Payer: Self-pay | Admitting: Gastroenterology

## 2018-08-15 ENCOUNTER — Other Ambulatory Visit: Payer: Self-pay | Admitting: Urgent Care

## 2018-08-21 DIAGNOSIS — H25013 Cortical age-related cataract, bilateral: Secondary | ICD-10-CM | POA: Diagnosis not present

## 2018-08-21 DIAGNOSIS — H353132 Nonexudative age-related macular degeneration, bilateral, intermediate dry stage: Secondary | ICD-10-CM | POA: Diagnosis not present

## 2018-08-21 DIAGNOSIS — H35372 Puckering of macula, left eye: Secondary | ICD-10-CM | POA: Diagnosis not present

## 2018-08-21 DIAGNOSIS — H35033 Hypertensive retinopathy, bilateral: Secondary | ICD-10-CM | POA: Diagnosis not present

## 2018-10-06 ENCOUNTER — Other Ambulatory Visit: Payer: Self-pay

## 2018-10-06 ENCOUNTER — Encounter (HOSPITAL_COMMUNITY): Payer: Self-pay

## 2018-10-06 ENCOUNTER — Ambulatory Visit (HOSPITAL_COMMUNITY)
Admission: EM | Admit: 2018-10-06 | Discharge: 2018-10-06 | Disposition: A | Discharge status: Home / Self Care | Payer: Medicare HMO | Attending: Family Medicine | Admitting: Family Medicine

## 2018-10-06 DIAGNOSIS — M545 Low back pain, unspecified: Secondary | ICD-10-CM

## 2018-10-06 MED ORDER — CYCLOBENZAPRINE HCL 10 MG PO TABS: 10.0000 mg | ORAL_TABLET | Freq: Two times a day (BID) | ORAL | 0 refills | Status: DC | PRN

## 2018-10-06 NOTE — ED Triage Notes (Signed)
Pt presents today with low back pain that started last night. States he did pick up and move stuff some at work. Has not had any falls. Describes the pain as an achy pain.
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
## 2018-10-06 NOTE — Discharge Instructions (Signed)
Recommend application of heat locally Recommend massage with Biofreeze or other analgesic cream

## 2018-10-08 ENCOUNTER — Ambulatory Visit (INDEPENDENT_AMBULATORY_CARE_PROVIDER_SITE_OTHER): Payer: Medicare HMO | Admitting: Emergency Medicine

## 2018-10-08 ENCOUNTER — Encounter: Payer: Self-pay | Admitting: Emergency Medicine

## 2018-10-08 ENCOUNTER — Other Ambulatory Visit: Payer: Self-pay

## 2018-10-08 ENCOUNTER — Ambulatory Visit (INDEPENDENT_AMBULATORY_CARE_PROVIDER_SITE_OTHER): Payer: Medicare HMO

## 2018-10-08 ENCOUNTER — Telehealth: Payer: Self-pay | Admitting: General Practice

## 2018-10-08 VITALS — BP 160/75 | HR 87 | Temp 98.0°F | Resp 16 | Ht 67.0 in | Wt 189.0 lb

## 2018-10-08 DIAGNOSIS — M7918 Myalgia, other site: Secondary | ICD-10-CM

## 2018-10-08 DIAGNOSIS — M62838 Other muscle spasm: Secondary | ICD-10-CM

## 2018-10-08 DIAGNOSIS — S39012A Strain of muscle, fascia and tendon of lower back, initial encounter: Secondary | ICD-10-CM

## 2018-10-08 DIAGNOSIS — M5441 Lumbago with sciatica, right side: Secondary | ICD-10-CM

## 2018-10-08 DIAGNOSIS — M545 Low back pain: Secondary | ICD-10-CM | POA: Diagnosis not present

## 2018-10-08 MED ORDER — MELOXICAM 7.5 MG PO TABS: 7.5000 mg | ORAL_TABLET | Freq: Every day | ORAL | 1 refills | Status: AC

## 2018-10-08 MED ORDER — KETOROLAC TROMETHAMINE 60 MG/2ML IM SOLN
60.0000 mg | Freq: Once | INTRAMUSCULAR | Status: AC
  Administered 2018-10-08: 60 mg via INTRAMUSCULAR

## 2018-10-08 MED ORDER — TRAMADOL HCL 50 MG PO TABS: 50.0000 mg | ORAL_TABLET | Freq: Three times a day (TID) | ORAL | 0 refills | Status: DC | PRN

## 2018-10-08 MED ORDER — CYCLOBENZAPRINE HCL 10 MG PO TABS: 10.0000 mg | ORAL_TABLET | Freq: Every day | ORAL | 0 refills | Status: DC

## 2018-10-08 NOTE — Patient Instructions (Addendum)
   If you have lab work done today you will be contacted with your lab results within the next 2 weeks.  If you have not heard from us then please contact us. The fastest way to get your results is to register for My Chart.   IF you received an x-ray today, you will receive an invoice from [facility]. Please contact [facility] at [phone] with questions or concerns regarding your invoice.   IF you received labwork today, you will receive an invoice from [facility]. Please contact [facility] at [phone] with questions or concerns regarding your invoice.   Our billing staff will not be able to assist you with questions regarding bills from these companies.  You will be contacted with the lab results as soon as they are available. The fastest way to get your results is to activate your My Chart account. Instructions are located on the last page of this paperwork. If you have not heard from us regarding the results in 2 weeks, please contact this office.     Acute Back Pain, Adult Acute back pain is sudden and usually short-lived. It is often caused by an injury to the muscles and tissues in the back. The injury may result from:  A muscle or ligament getting overstretched or torn (strained). Ligaments are tissues that connect bones to each other. Lifting something improperly can cause a back strain.  Wear and tear (degeneration) of the spinal disks. Spinal disks are circular tissue that provides cushioning between the bones of the spine (vertebrae).  Twisting motions, such as while playing sports or doing yard work.  A hit to the back.  Arthritis. You may have a physical exam, lab tests, and imaging tests to find the cause of your pain. Acute back pain usually goes away with rest and home care. Follow these instructions at home: Managing pain, stiffness, and swelling  Take over-the-counter and prescription medicines only as told by your health care  provider.  Your health care provider may recommend applying ice during the first 24-48 hours after your pain starts. To do this: ? Put ice in a plastic bag. ? Place a towel between your skin and the bag. ? Leave the ice on for 20 minutes, 2-3 times a day.  If directed, apply heat to the affected area as often as told by your health care provider. Use the heat source that your health care provider recommends, such as a moist heat pack or a heating pad. ? Place a towel between your skin and the heat source. ? Leave the heat on for 20-30 minutes. ? Remove the heat if your skin turns bright red. This is especially important if you are unable to feel pain, heat, or cold. You have a greater risk of getting burned. Activity   Do not stay in bed. Staying in bed for more than 1-2 days can delay your recovery.  Sit up and stand up straight. Avoid leaning forward when you sit, or hunching over when you stand. ? If you work at a desk, sit close to it so you do not need to lean over. Keep your chin tucked in. Keep your neck drawn back, and keep your elbows bent at a right angle. Your arms should look like the letter "L." ? Sit high and close to the steering wheel when you drive. Add lower back (lumbar) support to your car seat, if needed.  Take short walks on even surfaces as soon as you are able. Try   to increase the length of time you walk each day.  Do not sit, drive, or stand in one place for more than 30 minutes at a time. Sitting or standing for long periods of time can put stress on your back.  Do not drive or use heavy machinery while taking prescription pain medicine.  Use proper lifting techniques. When you bend and lift, use positions that put less stress on your back: ? Bend your knees. ? Keep the load close to your body. ? Avoid twisting.  Exercise regularly as told by your health care provider. Exercising helps your back heal faster and helps prevent back injuries by keeping muscles  strong and flexible.  Work with a physical therapist to make a safe exercise program, as recommended by your health care provider. Do any exercises as told by your physical therapist. Lifestyle  Maintain a healthy weight. Extra weight puts stress on your back and makes it difficult to have good posture.  Avoid activities or situations that make you feel anxious or stressed. Stress and anxiety increase muscle tension and can make back pain worse. Learn ways to manage anxiety and stress, such as through exercise. General instructions  Sleep on a firm mattress in a comfortable position. Try lying on your side with your knees slightly bent. If you lie on your back, put a pillow under your knees.  Follow your treatment plan as told by your health care provider. This may include: ? Cognitive or behavioral therapy. ? Acupuncture or massage therapy. ? Meditation or yoga. Contact a health care provider if:  You have pain that is not relieved with rest or medicine.  You have increasing pain going down into your legs or buttocks.  Your pain does not improve after 2 weeks.  You have pain at night.  You lose weight without trying.  You have a fever or chills. Get help right away if:  You develop new bowel or bladder control problems.  You have unusual weakness or numbness in your arms or legs.  You develop nausea or vomiting.  You develop abdominal pain.  You feel faint. Summary  Acute back pain is sudden and usually short-lived.  Use proper lifting techniques. When you bend and lift, use positions that put less stress on your back.  Take over-the-counter and prescription medicines and apply heat or ice as directed by your health care provider. This information is not intended to replace advice given to you by your health care provider. Make sure you discuss any questions you have with your health care provider. Document Released: 08/22/2005 Document Revised: 03/29/2018 Document  Reviewed: 04/05/2017 Elsevier Interactive Patient Education  2019 [employer]  

## 2018-10-08 NOTE — Telephone Encounter (Signed)
I tried to find pt's NEW medicare number using the website [employer]. Site states that the name and social security number does not match. I tried with his last name and first name listed on his license and then tried last name and first name listed on OLD medicare card. Neither would work with providing me NEW medicare number. I gave the pt the number to call and obtain a new card. I explained that [facility] would not pay the claims without the NEW medicare number. Pt stated that he would call 1-800-MEDICARE
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
## 2018-11-29 ENCOUNTER — Ambulatory Visit: Payer: Self-pay | Admitting: Physician Assistant

## 2018-11-30 ENCOUNTER — Other Ambulatory Visit: Payer: Self-pay

## 2018-11-30 ENCOUNTER — Encounter: Payer: Self-pay | Admitting: Family Medicine

## 2018-11-30 ENCOUNTER — Ambulatory Visit (INDEPENDENT_AMBULATORY_CARE_PROVIDER_SITE_OTHER): Payer: Medicare HMO | Admitting: Family Medicine

## 2018-11-30 DIAGNOSIS — F17200 Nicotine dependence, unspecified, uncomplicated: Secondary | ICD-10-CM

## 2018-11-30 DIAGNOSIS — R399 Unspecified symptoms and signs involving the genitourinary system: Secondary | ICD-10-CM | POA: Diagnosis not present

## 2018-11-30 DIAGNOSIS — Z125 Encounter for screening for malignant neoplasm of prostate: Secondary | ICD-10-CM | POA: Diagnosis not present

## 2018-11-30 DIAGNOSIS — D582 Other hemoglobinopathies: Secondary | ICD-10-CM | POA: Diagnosis not present

## 2018-11-30 DIAGNOSIS — I10 Essential (primary) hypertension: Secondary | ICD-10-CM

## 2018-11-30 DIAGNOSIS — R69 Illness, unspecified: Secondary | ICD-10-CM | POA: Diagnosis not present
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
## 2018-12-03 ENCOUNTER — Ambulatory Visit (INDEPENDENT_AMBULATORY_CARE_PROVIDER_SITE_OTHER): Payer: Medicare HMO | Admitting: Family Medicine

## 2018-12-03 ENCOUNTER — Other Ambulatory Visit: Payer: Self-pay

## 2018-12-03 DIAGNOSIS — R399 Unspecified symptoms and signs involving the genitourinary system: Secondary | ICD-10-CM

## 2018-12-03 DIAGNOSIS — D582 Other hemoglobinopathies: Secondary | ICD-10-CM

## 2018-12-03 DIAGNOSIS — R69 Illness, unspecified: Secondary | ICD-10-CM | POA: Diagnosis not present

## 2018-12-03 DIAGNOSIS — I10 Essential (primary) hypertension: Secondary | ICD-10-CM | POA: Diagnosis not present

## 2018-12-03 DIAGNOSIS — F17200 Nicotine dependence, unspecified, uncomplicated: Secondary | ICD-10-CM

## 2018-12-03 DIAGNOSIS — Z125 Encounter for screening for malignant neoplasm of prostate: Secondary | ICD-10-CM

## 2018-12-03 DIAGNOSIS — R972 Elevated prostate specific antigen [PSA]: Secondary | ICD-10-CM

## 2018-12-03 LAB — POCT URINALYSIS DIP (MANUAL ENTRY)
Bilirubin, UA: NEGATIVE
Glucose, UA: NEGATIVE mg/dL
Ketones, POC UA: NEGATIVE mg/dL
Leukocytes, UA: NEGATIVE
Nitrite, UA: NEGATIVE
Protein Ur, POC: NEGATIVE mg/dL
RBC UA: NEGATIVE
Spec Grav, UA: 1.015 (ref 1.010–1.025)
Urobilinogen, UA: 1 E.U./dL
pH, UA: 6 (ref 5.0–8.0)

## 2018-12-03 MED ORDER — TAMSULOSIN HCL 0.4 MG PO CAPS: 0.4000 mg | ORAL_CAPSULE | Freq: Two times a day (BID) | ORAL | 1 refills | Status: DC

## 2018-12-03 MED ORDER — LISINOPRIL 5 MG PO TABS: 5.0000 mg | ORAL_TABLET | Freq: Every day | ORAL | 1 refills | Status: DC

## 2018-12-03 MED ORDER — FINASTERIDE 5 MG PO TABS: 5.0000 mg | ORAL_TABLET | Freq: Every day | ORAL | 3 refills | Status: DC

## 2018-12-03 MED ORDER — OMEPRAZOLE 20 MG PO CPDR: 20.0000 mg | DELAYED_RELEASE_CAPSULE | Freq: Two times a day (BID) | ORAL | 3 refills | Status: DC

## 2018-12-03 MED ORDER — AMLODIPINE BESYLATE 5 MG PO TABS: 5.0000 mg | ORAL_TABLET | Freq: Every day | ORAL | 1 refills | Status: DC

## 2018-12-03 NOTE — Progress Notes (Signed)
Reconciling medications and refilled meds

## 2018-12-04 LAB — CBC
Hematocrit: 36.6 % — ABNORMAL LOW (ref 37.5–51.0)
Hemoglobin: 11.8 g/dL — ABNORMAL LOW (ref 13.0–17.7)
MCH: 20.7 pg — ABNORMAL LOW (ref 26.6–33.0)
MCHC: 32.2 g/dL (ref 31.5–35.7)
MCV: 64 fL — AB (ref 79–97)
Platelets: 163 10*3/uL (ref 150–450)
RBC: 5.71 x10E6/uL (ref 4.14–5.80)
RDW: 18.7 % — ABNORMAL HIGH (ref 11.6–15.4)
WBC: 6.6 10*3/uL (ref 3.4–10.8)

## 2018-12-04 LAB — CMP14+EGFR
ALBUMIN: 4.6 g/dL (ref 3.8–4.8)
ALT: 36 IU/L (ref 0–44)
AST: 24 IU/L (ref 0–40)
Albumin/Globulin Ratio: 1.7 (ref 1.2–2.2)
Alkaline Phosphatase: 91 IU/L (ref 39–117)
BUN / CREAT RATIO: 19 (ref 10–24)
BUN: 18 mg/dL (ref 8–27)
Bilirubin Total: 0.5 mg/dL (ref 0.0–1.2)
CO2: 21 mmol/L (ref 20–29)
Calcium: 8.5 mg/dL — ABNORMAL LOW (ref 8.6–10.2)
Chloride: 101 mmol/L (ref 96–106)
Creatinine, Ser: 0.95 mg/dL (ref 0.76–1.27)
GFR calc non Af Amer: 82 mL/min/{1.73_m2} (ref >59)
GFR, EST AFRICAN AMERICAN: 95 mL/min/{1.73_m2} (ref >59)
Globulin, Total: 2.7 g/dL (ref 1.5–4.5)
Glucose: 145 mg/dL — ABNORMAL HIGH (ref 65–99)
Potassium: 3.8 mmol/L (ref 3.5–5.2)
Sodium: 139 mmol/L (ref 134–144)
TOTAL PROTEIN: 7.3 g/dL (ref 6.0–8.5)

## 2018-12-04 LAB — LIPID PANEL
Chol/HDL Ratio: 3.8 ratio (ref 0.0–5.0)
Cholesterol, Total: 141 mg/dL (ref 100–199)
HDL: 37 mg/dL — ABNORMAL LOW (ref >39)
LDL Calculated: 67 mg/dL (ref 0–99)
Triglycerides: 184 mg/dL — ABNORMAL HIGH (ref 0–149)
VLDL Cholesterol Cal: 37 mg/dL (ref 5–40)

## 2018-12-04 LAB — TSH: TSH: 2.91 u[IU]/mL (ref 0.450–4.500)

## 2018-12-04 LAB — PSA: Prostate Specific Ag, Serum: 4.5 ng/mL — ABNORMAL HIGH (ref 0.0–4.0)

## 2018-12-04 LAB — HEMOGLOBIN A1C
Est. average glucose Bld gHb Est-mCnc: 103 mg/dL
Hgb A1c MFr Bld: 5.2 % (ref 4.8–5.6)

## 2018-12-11 NOTE — Addendum Note (Signed)
Addended by: Delia Chimes A on: 12/11/2018 11:31 AM   Modules accepted: Orders

## 2019-01-01 DIAGNOSIS — R3914 Feeling of incomplete bladder emptying: Secondary | ICD-10-CM | POA: Diagnosis not present

## 2019-01-01 DIAGNOSIS — R3912 Poor urinary stream: Secondary | ICD-10-CM | POA: Diagnosis not present

## 2019-01-01 DIAGNOSIS — N401 Enlarged prostate with lower urinary tract symptoms: Secondary | ICD-10-CM | POA: Diagnosis not present

## 2019-01-01 DIAGNOSIS — R972 Elevated prostate specific antigen [PSA]: Secondary | ICD-10-CM | POA: Diagnosis not present

## 2019-01-25 DIAGNOSIS — R972 Elevated prostate specific antigen [PSA]: Secondary | ICD-10-CM | POA: Diagnosis not present

## 2019-02-06 ENCOUNTER — Telehealth: Payer: Medicare HMO | Admitting: Family Medicine

## 2019-02-06 ENCOUNTER — Telehealth (INDEPENDENT_AMBULATORY_CARE_PROVIDER_SITE_OTHER): Payer: Medicare HMO | Admitting: Family Medicine

## 2019-02-06 ENCOUNTER — Other Ambulatory Visit: Payer: Self-pay

## 2019-02-06 DIAGNOSIS — Z7189 Other specified counseling: Secondary | ICD-10-CM
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
## 2019-02-15 DIAGNOSIS — R972 Elevated prostate specific antigen [PSA]: Secondary | ICD-10-CM | POA: Diagnosis not present

## 2019-02-15 DIAGNOSIS — R3914 Feeling of incomplete bladder emptying: Secondary | ICD-10-CM | POA: Diagnosis not present

## 2019-02-15 DIAGNOSIS — N401 Enlarged prostate with lower urinary tract symptoms: Secondary | ICD-10-CM | POA: Diagnosis not present
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
## 2019-05-03 DIAGNOSIS — R69 Illness, unspecified: Secondary | ICD-10-CM | POA: Diagnosis not present

## 2019-05-30 ENCOUNTER — Other Ambulatory Visit: Payer: Self-pay | Admitting: Family Medicine

## 2019-05-30 NOTE — Telephone Encounter (Signed)
Forwarding medication refill request to the clinical pool for review. 

## 2019-07-22 ENCOUNTER — Encounter (HOSPITAL_COMMUNITY): Payer: Self-pay

## 2019-07-22 ENCOUNTER — Ambulatory Visit (INDEPENDENT_AMBULATORY_CARE_PROVIDER_SITE_OTHER): Payer: Medicare HMO

## 2019-07-22 ENCOUNTER — Other Ambulatory Visit: Payer: Self-pay

## 2019-07-22 ENCOUNTER — Ambulatory Visit (HOSPITAL_COMMUNITY)
Admission: EM | Admit: 2019-07-22 | Discharge: 2019-07-22 | Disposition: A | Discharge status: Home / Self Care | Payer: Medicare HMO | Attending: Internal Medicine | Admitting: Internal Medicine

## 2019-07-22 DIAGNOSIS — R05 Cough: Secondary | ICD-10-CM | POA: Diagnosis not present

## 2019-07-22 DIAGNOSIS — Z79899 Other long term (current) drug therapy: Secondary | ICD-10-CM | POA: Diagnosis not present

## 2019-07-22 DIAGNOSIS — F17210 Nicotine dependence, cigarettes, uncomplicated: Secondary | ICD-10-CM | POA: Diagnosis not present

## 2019-07-22 DIAGNOSIS — B9789 Other viral agents as the cause of diseases classified elsewhere: Secondary | ICD-10-CM | POA: Diagnosis not present

## 2019-07-22 DIAGNOSIS — J069 Acute upper respiratory infection, unspecified: Secondary | ICD-10-CM

## 2019-07-22 DIAGNOSIS — R69 Illness, unspecified: Secondary | ICD-10-CM | POA: Diagnosis not present

## 2019-07-22 DIAGNOSIS — Z20828 Contact with and (suspected) exposure to other viral communicable diseases: Secondary | ICD-10-CM | POA: Diagnosis not present

## 2019-07-22 MED ORDER — HYDROCODONE-HOMATROPINE 5-1.5 MG/5ML PO SYRP: 5.0000 mL | ORAL_SOLUTION | Freq: Four times a day (QID) | ORAL | 0 refills | Status: DC | PRN
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
## 2019-07-22 NOTE — ED Triage Notes (Signed)
Pt presents with non productive cough since yesterday.

## 2019-07-24 LAB — NOVEL CORONAVIRUS, NAA (HOSP ORDER, SEND-OUT TO REF LAB; TAT 18-24 HRS): SARS-CoV-2, NAA: NOT DETECTED

## 2019-08-22 ENCOUNTER — Other Ambulatory Visit: Payer: Self-pay | Admitting: Family Medicine

## 2019-08-22 ENCOUNTER — Other Ambulatory Visit: Payer: Self-pay | Admitting: Emergency Medicine

## 2019-08-22 DIAGNOSIS — I10 Essential (primary) hypertension: Secondary | ICD-10-CM

## 2019-08-22 MED ORDER — LISINOPRIL 5 MG PO TABS: 5.0000 mg | ORAL_TABLET | Freq: Every day | ORAL | 0 refills | Status: DC

## 2019-08-22 NOTE — Telephone Encounter (Signed)
Pls schedule patient an appot for med refills. No further refills without office visit

## 2019-08-22 NOTE — Telephone Encounter (Signed)
Forwarding medication refill request to the clinical pool for review. 

## 2019-08-27 ENCOUNTER — Other Ambulatory Visit: Payer: Self-pay

## 2019-08-27 ENCOUNTER — Ambulatory Visit (INDEPENDENT_AMBULATORY_CARE_PROVIDER_SITE_OTHER): Payer: Medicare HMO | Admitting: Family Medicine

## 2019-08-27 ENCOUNTER — Encounter: Payer: Self-pay | Admitting: Family Medicine

## 2019-08-27 VITALS — BP 136/70 | HR 79 | Temp 98.9°F | Ht 67.0 in | Wt 186.6 lb

## 2019-08-27 DIAGNOSIS — R1011 Right upper quadrant pain: Secondary | ICD-10-CM

## 2019-08-27 DIAGNOSIS — I10 Essential (primary) hypertension: Secondary | ICD-10-CM | POA: Diagnosis not present

## 2019-08-27 LAB — COMPREHENSIVE METABOLIC PANEL
ALT: 26 IU/L (ref 0–44)
AST: 21 IU/L (ref 0–40)
Albumin/Globulin Ratio: 1.5 (ref 1.2–2.2)
Albumin: 4.1 g/dL (ref 3.8–4.8)
Alkaline Phosphatase: 85 IU/L (ref 39–117)
BUN/Creatinine Ratio: 19 (ref 10–24)
BUN: 18 mg/dL (ref 8–27)
Bilirubin Total: 0.5 mg/dL (ref 0.0–1.2)
CO2: 19 mmol/L — ABNORMAL LOW (ref 20–29)
Calcium: 8.7 mg/dL (ref 8.6–10.2)
Chloride: 108 mmol/L — ABNORMAL HIGH (ref 96–106)
Creatinine, Ser: 0.96 mg/dL (ref 0.76–1.27)
GFR calc Af Amer: 94 mL/min/{1.73_m2} (ref >59)
GFR calc non Af Amer: 81 mL/min/{1.73_m2} (ref >59)
Globulin, Total: 2.7 g/dL (ref 1.5–4.5)
Glucose: 94 mg/dL (ref 65–99)
Potassium: 4.1 mmol/L (ref 3.5–5.2)
Sodium: 141 mmol/L (ref 134–144)
Total Protein: 6.8 g/dL (ref 6.0–8.5)

## 2019-08-27 LAB — POCT URINALYSIS DIP (MANUAL ENTRY)
Bilirubin, UA: NEGATIVE
Blood, UA: NEGATIVE
Glucose, UA: NEGATIVE mg/dL
Ketones, POC UA: NEGATIVE mg/dL
Leukocytes, UA: NEGATIVE
Nitrite, UA: NEGATIVE
Protein Ur, POC: NEGATIVE mg/dL
Spec Grav, UA: 1.025 (ref 1.010–1.025)
Urobilinogen, UA: NEGATIVE E.U./dL — AB
pH, UA: 6 (ref 5.0–8.0)

## 2019-08-27 MED ORDER — AMLODIPINE BESYLATE 5 MG PO TABS: 5.0000 mg | ORAL_TABLET | Freq: Every day | ORAL | 1 refills | Status: DC

## 2019-08-27 MED ORDER — LISINOPRIL 5 MG PO TABS: 5.0000 mg | ORAL_TABLET | Freq: Every day | ORAL | 1 refills | Status: DC
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
## 2019-08-27 NOTE — Patient Instructions (Signed)
° ° ° °  If you have lab work done today you will be contacted with your lab results within the next 2 weeks.  If you have not heard from us then please contact us. The fastest way to get your results is to register for My Chart. ° ° °IF you received an x-ray today, you will receive an invoice from [facility]. Please contact [facility] at [phone] with questions or concerns regarding your invoice.  ° °IF you received labwork today, you will receive an invoice from [facility]. Please contact [facility] at [phone] with questions or concerns regarding your invoice.  ° °Our billing staff will not be able to assist you with questions regarding bills from these companies. ° °You will be contacted with the lab results as soon as they are available. The fastest way to get your results is to activate your My Chart account. Instructions are located on the last page of this paperwork. If you have not heard from us regarding the results in 2 weeks, please contact this office. °  ° ° ° °

## 2019-09-16 ENCOUNTER — Ambulatory Visit
Admission: RE | Admit: 2019-09-16 | Discharge: 2019-09-16 | Disposition: A | Discharge status: Home / Self Care | Payer: Medicare HMO | Source: Ambulatory Visit | Attending: Family Medicine | Admitting: Family Medicine

## 2019-09-16 DIAGNOSIS — R1011 Right upper quadrant pain: Secondary | ICD-10-CM

## 2019-11-19 ENCOUNTER — Other Ambulatory Visit: Payer: Self-pay | Admitting: Family Medicine

## 2019-11-19 NOTE — Telephone Encounter (Signed)
Requested Prescriptions  Pending Prescriptions Disp Refills  . finasteride (PROSCAR) 5 MG tablet [Pharmacy Med Name: FINASTERIDE 5 MG TABLET] 90 tablet 2    Sig: TAKE 1 TABLET BY MOUTH EVERY DAY     Urology: 5-alpha Reductase Inhibitors Passed - 11/19/2019 12:11 AM      Passed - Valid encounter within last 12 months    Recent Outpatient Visits          2 months ago Essential hypertension   [facility], Lilia Argue, MD   9 months ago Advice Given About Covid-19 Virus by Telephone   [facility], Lilia Argue, MD   11 months ago Elevated PSA, less than 10 ng/ml   [facility] at [place], MD   11 months ago Essential hypertension   [facility] at [facility], Arlie Solomons, MD   1 year ago Strain of lumbar region, initial encounter   Primary [facility] at [facility], Ines Bloomer, MD      Future Appointments            In 3 months Rutherford Guys, MD Primary Care at [facility], [facility]           . tamsulosin (FLOMAX) 0.4 MG CAPS capsule [Pharmacy Med Name: TAMSULOSIN HCL 0.4 MG CAPSULE] 180 capsule 1    Sig: TAKE 1 CAPSULE (0.4 MG TOTAL) BY MOUTH 2 (TWO) TIMES DAILY.     Urology: Alpha-Adrenergic Blocker Passed - 11/19/2019 12:11 AM      Passed - Last BP in normal range    BP Readings from Last 1 Encounters:  08/27/19 136/70         Passed - Valid encounter within last 12 months    Recent Outpatient Visits          2 months ago Essential hypertension   [facility], Lilia Argue, MD   9 months ago Advice Given About Covid-19 Virus by Telephone   [facility], Lilia Argue, MD   11 months ago Elevated PSA, less than 10 ng/ml   [facility] at [place], MD   11 months ago Essential hypertension   Primary Care at [facility], Arlie Solomons, MD   1 year ago Strain of lumbar region, initial encounter   Primary Care at [facility], Ines Bloomer, MD      Future Appointments            In 3 months  Pamella Pert, Lilia Argue, MD Primary Care at [place], [facility]

## 2019-11-30 ENCOUNTER — Ambulatory Visit: Payer: PPO | Attending: Internal Medicine

## 2019-11-30 DIAGNOSIS — Z23 Encounter for immunization: Secondary | ICD-10-CM

## 2019-11-30 NOTE — Progress Notes (Signed)
   Covid-19 Vaccination Clinic  Name:  NIJAL BENTLER    MRN: [id number] DOB: 07/07/1951  11/30/2019  Mr. Spigelmyer was observed post Covid-19 immunization for 15 minutes without incident. He was provided with Vaccine Information Sheet and instruction to access the V-Safe system.   Mr. Sferrazza was instructed to call 911 with any severe reactions post vaccine: Marland Kitchen Difficulty breathing  . Swelling of face and throat  . A fast heartbeat  . A bad rash all over body  . Dizziness and weakness

## 2020-02-07 ENCOUNTER — Ambulatory Visit (INDEPENDENT_AMBULATORY_CARE_PROVIDER_SITE_OTHER): Payer: PPO | Admitting: Family Medicine

## 2020-02-07 ENCOUNTER — Other Ambulatory Visit: Payer: Self-pay

## 2020-02-07 ENCOUNTER — Encounter: Payer: Self-pay | Admitting: Family Medicine

## 2020-02-07 VITALS — BP 136/70 | HR 71 | Temp 97.8°F | Ht 67.0 in | Wt 189.9 lb

## 2020-02-07 DIAGNOSIS — R600 Localized edema: Secondary | ICD-10-CM

## 2020-02-07 DIAGNOSIS — K219 Gastro-esophageal reflux disease without esophagitis: Secondary | ICD-10-CM

## 2020-02-07 DIAGNOSIS — I10 Essential (primary) hypertension: Secondary | ICD-10-CM

## 2020-02-07 MED ORDER — LISINOPRIL-HYDROCHLOROTHIAZIDE 10-12.5 MG PO TABS: 1.0000 | ORAL_TABLET | Freq: Every day | ORAL | 1 refills | Status: DC

## 2020-02-07 MED ORDER — OMEPRAZOLE 20 MG PO CPDR: 20.0000 mg | DELAYED_RELEASE_CAPSULE | Freq: Two times a day (BID) | ORAL | 3 refills | Status: DC

## 2020-02-07 NOTE — Patient Instructions (Signed)
° ° ° °  If you have lab work done today you will be contacted with your lab results within the next 2 weeks.  If you have not heard from us then please contact us. The fastest way to get your results is to register for My Chart. ° ° °IF you received an x-ray today, you will receive an invoice from [facility]. Please contact [facility] at [phone] with questions or concerns regarding your invoice.  ° °IF you received labwork today, you will receive an invoice from [facility]. Please contact [facility] at [phone] with questions or concerns regarding your invoice.  ° °Our billing staff will not be able to assist you with questions regarding bills from these companies. ° °You will be contacted with the lab results as soon as they are available. The fastest way to get your results is to activate your My Chart account. Instructions are located on the last page of this paperwork. If you have not heard from us regarding the results in 2 weeks, please contact this office. °  ° ° ° °
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
## 2020-02-14 ENCOUNTER — Other Ambulatory Visit: Payer: Self-pay | Admitting: Family Medicine

## 2020-02-14 DIAGNOSIS — I10 Essential (primary) hypertension: Secondary | ICD-10-CM

## 2020-02-25 ENCOUNTER — Ambulatory Visit: Payer: Medicare HMO | Admitting: Family Medicine

## 2020-03-06 DIAGNOSIS — R972 Elevated prostate specific antigen [PSA]: Secondary | ICD-10-CM | POA: Diagnosis not present

## 2020-03-12 ENCOUNTER — Other Ambulatory Visit: Payer: Self-pay

## 2020-03-12 ENCOUNTER — Encounter: Payer: Self-pay | Admitting: Family Medicine

## 2020-03-12 ENCOUNTER — Ambulatory Visit (INDEPENDENT_AMBULATORY_CARE_PROVIDER_SITE_OTHER): Payer: PPO | Admitting: Family Medicine

## 2020-03-12 VITALS — BP 151/75 | HR 65 | Temp 98.5°F | Ht 67.0 in | Wt 190.4 lb

## 2020-03-12 DIAGNOSIS — K0889 Other specified disorders of teeth and supporting structures: Secondary | ICD-10-CM | POA: Diagnosis not present

## 2020-03-12 DIAGNOSIS — K0381 Cracked tooth: Secondary | ICD-10-CM

## 2020-03-12 DIAGNOSIS — I10 Essential (primary) hypertension: Secondary | ICD-10-CM | POA: Diagnosis not present

## 2020-03-12 DIAGNOSIS — K219 Gastro-esophageal reflux disease without esophagitis: Secondary | ICD-10-CM | POA: Insufficient documentation

## 2020-03-12 DIAGNOSIS — Z23 Encounter for immunization: Secondary | ICD-10-CM | POA: Diagnosis not present

## 2020-03-12 HISTORY — DX: Essential (primary) hypertension: I10

## 2020-03-12 MED ORDER — LISINOPRIL-HYDROCHLOROTHIAZIDE 10-12.5 MG PO TABS: 1.0000 | ORAL_TABLET | Freq: Every day | ORAL | 1 refills | Status: DC

## 2020-03-12 NOTE — Patient Instructions (Signed)
° ° ° °  If you have lab work done today you will be contacted with your lab results within the next 2 weeks.  If you have not heard from us then please contact us. The fastest way to get your results is to register for My Chart. ° ° °IF you received an x-ray today, you will receive an invoice from [facility]. Please contact [facility] at [phone] with questions or concerns regarding your invoice.  ° °IF you received labwork today, you will receive an invoice from [facility]. Please contact [facility] at [phone] with questions or concerns regarding your invoice.  ° °Our billing staff will not be able to assist you with questions regarding bills from these companies. ° °You will be contacted with the lab results as soon as they are available. The fastest way to get your results is to activate your My Chart account. Instructions are located on the last page of this paperwork. If you have not heard from us regarding the results in 2 weeks, please contact this office. °  ° ° ° °
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
## 2020-03-13 DIAGNOSIS — R350 Frequency of micturition: Secondary | ICD-10-CM | POA: Diagnosis not present

## 2020-03-13 DIAGNOSIS — N401 Enlarged prostate with lower urinary tract symptoms: Secondary | ICD-10-CM | POA: Diagnosis not present

## 2020-03-13 DIAGNOSIS — R3915 Urgency of urination: Secondary | ICD-10-CM | POA: Diagnosis not present

## 2020-03-13 DIAGNOSIS — R3916 Straining to void: Secondary | ICD-10-CM | POA: Diagnosis not present

## 2020-03-13 DIAGNOSIS — R351 Nocturia: Secondary | ICD-10-CM | POA: Diagnosis not present

## 2020-03-13 DIAGNOSIS — R3912 Poor urinary stream: Secondary | ICD-10-CM | POA: Diagnosis not present

## 2020-03-13 LAB — COMPREHENSIVE METABOLIC PANEL
ALT: 34 IU/L (ref 0–44)
AST: 18 IU/L (ref 0–40)
Albumin/Globulin Ratio: 1.4 (ref 1.2–2.2)
Albumin: 4.3 g/dL (ref 3.8–4.8)
Alkaline Phosphatase: 97 IU/L (ref 48–121)
BUN/Creatinine Ratio: 26 — ABNORMAL HIGH (ref 10–24)
BUN: 25 mg/dL (ref 8–27)
Bilirubin Total: 0.9 mg/dL (ref 0.0–1.2)
CO2: 24 mmol/L (ref 20–29)
Calcium: 9.2 mg/dL (ref 8.6–10.2)
Chloride: 104 mmol/L (ref 96–106)
Creatinine, Ser: 0.95 mg/dL (ref 0.76–1.27)
GFR calc Af Amer: 95 mL/min/{1.73_m2} (ref >59)
GFR calc non Af Amer: 82 mL/min/{1.73_m2} (ref >59)
Globulin, Total: 3.1 g/dL (ref 1.5–4.5)
Glucose: 112 mg/dL — ABNORMAL HIGH (ref 65–99)
Potassium: 4.2 mmol/L (ref 3.5–5.2)
Sodium: 140 mmol/L (ref 134–144)
Total Protein: 7.4 g/dL (ref 6.0–8.5)

## 2020-03-13 LAB — LIPID PANEL
Chol/HDL Ratio: 4.2 ratio (ref 0.0–5.0)
Cholesterol, Total: 154 mg/dL (ref 100–199)
HDL: 37 mg/dL — ABNORMAL LOW (ref >39)
LDL Chol Calc (NIH): 88 mg/dL (ref 0–99)
Triglycerides: 165 mg/dL — ABNORMAL HIGH (ref 0–149)
VLDL Cholesterol Cal: 29 mg/dL (ref 5–40)

## 2020-04-02 ENCOUNTER — Encounter: Payer: Self-pay | Admitting: Radiology

## 2020-04-30 ENCOUNTER — Other Ambulatory Visit: Payer: Self-pay | Admitting: Family Medicine

## 2020-04-30 NOTE — Telephone Encounter (Signed)
Requested Prescriptions  Pending Prescriptions Disp Refills  . omeprazole (PRILOSEC) 20 MG capsule [Pharmacy Med Name: OMEPRAZOLE DR 20 MG CAPSULE] 180 capsule 3    Sig: TAKE 1 CAPSULE (20 MG TOTAL) BY MOUTH 2 (TWO) TIMES DAILY BEFORE A MEAL.     Gastroenterology: Proton Pump Inhibitors Passed - 04/30/2020 12:08 AM      Passed - Valid encounter within last 12 months    Recent Outpatient Visits          1 month ago Essential hypertension   [facility], Lilia Argue, MD   2 months ago Essential hypertension   [facility], Lilia Argue, MD   8 months ago Essential hypertension   [facility], Lilia Argue, MD   1 year ago Advice Given About Covid-19 Virus by Telephone   [facility], Lilia Argue, MD   1 year ago Elevated PSA, less than 10 ng/ml   [facility] at [facility], Arlie Solomons, MD      Future Appointments            In 1 month Pamella Pert, Lilia Argue, MD Primary Care at [place], [facility]

## 2020-06-12 ENCOUNTER — Other Ambulatory Visit: Payer: Self-pay

## 2020-06-12 ENCOUNTER — Ambulatory Visit (INDEPENDENT_AMBULATORY_CARE_PROVIDER_SITE_OTHER): Payer: PPO | Admitting: Family Medicine

## 2020-06-12 ENCOUNTER — Encounter: Payer: Self-pay | Admitting: Family Medicine

## 2020-06-12 VITALS — BP 139/70 | HR 75 | Temp 98.4°F | Ht 67.0 in | Wt 189.6 lb

## 2020-06-12 DIAGNOSIS — Z23 Encounter for immunization: Secondary | ICD-10-CM

## 2020-06-12 DIAGNOSIS — G4719 Other hypersomnia: Secondary | ICD-10-CM

## 2020-06-12 DIAGNOSIS — I10 Essential (primary) hypertension: Secondary | ICD-10-CM | POA: Diagnosis not present

## 2020-06-12 LAB — CMP14+EGFR
ALT: 29 IU/L (ref 0–44)
AST: 17 IU/L (ref 0–40)
Albumin/Globulin Ratio: 1.4 (ref 1.2–2.2)
Albumin: 4.4 g/dL (ref 3.8–4.8)
Alkaline Phosphatase: 85 IU/L (ref 44–121)
BUN/Creatinine Ratio: 19 (ref 10–24)
BUN: 18 mg/dL (ref 8–27)
Bilirubin Total: 0.9 mg/dL (ref 0.0–1.2)
CO2: 25 mmol/L (ref 20–29)
Calcium: 9.1 mg/dL (ref 8.6–10.2)
Chloride: 101 mmol/L (ref 96–106)
Creatinine, Ser: 0.93 mg/dL (ref 0.76–1.27)
GFR calc Af Amer: 97 mL/min/{1.73_m2} (ref >59)
GFR calc non Af Amer: 84 mL/min/{1.73_m2} (ref >59)
Globulin, Total: 3.2 g/dL (ref 1.5–4.5)
Glucose: 85 mg/dL (ref 65–99)
Potassium: 4 mmol/L (ref 3.5–5.2)
Sodium: 138 mmol/L (ref 134–144)
Total Protein: 7.6 g/dL (ref 6.0–8.5)

## 2020-06-12 LAB — LIPID PANEL
Chol/HDL Ratio: 3.9 ratio (ref 0.0–5.0)
Cholesterol, Total: 132 mg/dL (ref 100–199)
HDL: 34 mg/dL — ABNORMAL LOW (ref >39)
LDL Chol Calc (NIH): 70 mg/dL (ref 0–99)
Triglycerides: 163 mg/dL — ABNORMAL HIGH (ref 0–149)
VLDL Cholesterol Cal: 28 mg/dL (ref 5–40)

## 2020-06-12 MED ORDER — LISINOPRIL-HYDROCHLOROTHIAZIDE 10-12.5 MG PO TABS: 1.0000 | ORAL_TABLET | Freq: Every day | ORAL | 1 refills | Status: DC

## 2020-06-12 NOTE — Patient Instructions (Signed)
° ° ° °  If you have lab work done today you will be contacted with your lab results within the next 2 weeks.  If you have not heard from us then please contact us. The fastest way to get your results is to register for My Chart. ° ° °IF you received an x-ray today, you will receive an invoice from [facility]. Please contact [facility] at [phone] with questions or concerns regarding your invoice.  ° °IF you received labwork today, you will receive an invoice from [facility]. Please contact [facility] at [phone] with questions or concerns regarding your invoice.  ° °Our billing staff will not be able to assist you with questions regarding bills from these companies. ° °You will be contacted with the lab results as soon as they are available. The fastest way to get your results is to activate your My Chart account. Instructions are located on the last page of this paperwork. If you have not heard from us regarding the results in 2 weeks, please contact this office. °  ° ° ° °
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
## 2020-07-22 ENCOUNTER — Institutional Professional Consult (permissible substitution): Payer: PPO | Admitting: Neurology

## 2020-07-22 ENCOUNTER — Encounter: Payer: Self-pay | Admitting: Neurology

## 2020-07-22 ENCOUNTER — Ambulatory Visit (INDEPENDENT_AMBULATORY_CARE_PROVIDER_SITE_OTHER): Payer: PPO | Admitting: Neurology

## 2020-07-22 VITALS — BP 145/80 | HR 74 | Ht 69.0 in | Wt 190.0 lb

## 2020-07-22 DIAGNOSIS — G4719 Other hypersomnia: Secondary | ICD-10-CM

## 2020-07-22 DIAGNOSIS — G478 Other sleep disorders: Secondary | ICD-10-CM

## 2020-07-22 DIAGNOSIS — R0683 Snoring: Secondary | ICD-10-CM

## 2020-07-22 DIAGNOSIS — G4726 Circadian rhythm sleep disorder, shift work type: Secondary | ICD-10-CM | POA: Diagnosis not present
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
## 2020-07-22 NOTE — Patient Instructions (Signed)
S?ng cng v?i ng?ng th? khi ng?  Sleep Apnea Ng?ng th?? khi ngu? la? m?t ti?nh tra?ng trong ? c ng??ng th? ho??c th? nng trong lu?c ngu?Bobby Bobby [name] nhn ph? bi?n nh?t gy ng?ng th? khi ng? l ????ng th?? bi? xe?p ho??c b? t??c. Nh?ng ng??i b? ng?ng th? khi ng? s? ngy to v c nh?ng lc th? h?n h?n v ng?ng th? trong 10 giy ho?c lu h?n trong khi ng?. Tnh tr?ng ny x?y ra l?p ?i l?p l?i su?t ?m. ?i?u ny lm gin ?o?n gi?c ng? v khi?n c? th? khng ???c ngh? ng?i c?n thi?t, ?i?u ? c th? gy m?t m?i v thi?u n?ng l??ng (m?t m?i) trong ngy. Ng?ng th? c?ng lm gin ?o?n gi?c ng? su m qu v? c?n ?? c?m th?y ???c ngh? ng?i. Ngay c? khi qu v? khng th?c hon ton trong nh?ng lc th? h?n h?n, gi?c ng? c?a qu v? c th? khng ???c m ??m. Qu v? c?ng c th? b? ?au ??u vo bu?i sng v thi?u n?ng l??ng trong ngy v qu v? c th? c?m th?y lo l?ng ho?c chn n?n. Ng?ng th? khi ng? c th? ?nh h??ng nh? th? no ??n ti? Ng?ng th? khi ng? lm t?ng nguy c? b? m?t m?i qu m?c trong ngy (m?t m?i vo ban ngy). N c?ng c th? lm t?ng nguy c? b? cc tnh tr?ng b?nh l, ch?ng h?n nh?:  Nh?i mu c? tim.  ??t qu?Bobby [name] Kitchen  Ti?u ???ng.  Suy tim.  Nh?p tim khng ??u.  Huy?t p cao. N?u qu v? b? m?t m?i vo ban ngy do ng?ng th? khi ng?, qu v? d? b?:  Thnh tch km trong h?c hnh ho?c cng vi?c.  Ng? trong khi li xe.  Kh t?p trung ch .  B? tr?m ca?m ho??c lo u.  Th?a cn r?t nhi?u (bo ph).  R?i loa?n ch??c n?ng ti?nh du?c. Ti c th? th?c hi?n nh?ng hnh ??ng no ?? x? tr ng?ng th? khi ng?? ?i?u tr? ng?ng th? khi ng?   N?u quy? vi? ????c cho du?ng m?t du?ng cu? khai thng ????ng th?? khi quy? vi? ngu?, ha?y s?? du?ng theo chi? d?n cu?a chuyn gia ch?m so?c s??c kho?e. Qu v? c th? ???c cho dng: ? Du?ng cu? ?? mi?ng. ?y la? m?t du?ng cu? ???t v??a v?n trong mi?ng ?? ??y ha?m d???i cu?a quy? vi? v? phi?a tr???c. ? Ma?y a?p su?t ????ng th?? d??ng lin tu?c (CPAP). Thi?t b?  ny th?i kh qua m?t m?t n? khi qu v? th? ra. ? Ma?y a?p su?t ????ng th?? d??ng ?? thi? th?? ra (EPAP) qua m?i. Thi?t bi? na?y co? ca?c van ??t va?o m?i l? mu?i. ? Ma?y a?p su?t ????ng th?? d??ng hai thi? (BPAP). Thi?t b? ny th?i kh qua m?t m?t n? khi qu v? th? vo (ht vo) v th? ra.  Qu v? c th? c?n ???c ph?u thu?t n?u ca?c bi?n pha?p ?i?u tri? kha?c khng co? ta?c du?ng v?i qu v?. Thi quen ng?  ?i ng? v th?c d?y vo cng m?t th?i gian m?i ngy. ?i?u ny gip thi?t l?p ??ng h? c? th? (nh?p sinh h?c) c?a qu v? cho gi?c ng?. ? N?u qu v? v?n ?i ng? mu?n h?n bnh th??ng, ch?ng h?n nh? vo cu?i tu?n, hy c? g?ng d?y vo bu?i sng trong vng 2 gi? sau th?i gian th?c d?y bnh th??ng c?a qu v?.  C? g??ng ngu? t nh?t 7-9 ti?ng m?i ?m.  D?ng s?  d?ng my tnh, my tnh b?ng v ?i?n tho?i di ??ng m?t vi gi? tr??c khi ?i ng?.  Khng ch?p m?t trong th?i gian di vo ban ngy. N?u qu v? ch?p m?t, hy gi?i h?n th?i gian ny trong 30 pht.  C thi quen th? gin khi ?i ng?. ??c ho?c nghe nh?c c th? gip qu v? th? l?ng v d? ng?.  Ch? s? d?ng phng ng? ?? ng?. ? ?? tivi v my tnh bn ngoi phng ng?. ? Gi? cho phng ng? mt m?, t?i v yn t?nh. ? S? d?ng ??m v g?i nng ??.  Tun th? h??ng d?n c?a chuyn gia ch?m Bobby Bobby [name] v? nh?ng thay ??i khc ??i v?i thi quen ng?Bobby Bobby [name] d??ng  Khng ?n cc b?a ?n kh tiu vo bu?i t?i.  Khng dng caffeine vo cu?i ngy. Tc d?ng c?a caffeine c th? ko di h?n 5 gi?Bobby Bobby [name] theo h??ng d?n c?a chuyn gia ch?m Bobby Bobby [name] s?c Bobby [name] ho?c chuyn gia dinh d??ng v? b?t k? thay ??i no v?i ch? ?? ?n. L?i s?ng      Khng u?ng r??u tr??c khi ?i ng?. R??u bia c th? khi?n qu v? ng? lc ??u, nh?ng l?i khi?n qu v? th?c d?y vo gi?a ?m v kh ng? tr? l?i.  Khng s? d?ng b?t k? s?n ph?m no c nicotine ho?c thu?c l, ch?ng ha?n nh? thu?c l d?ng ht v thu?c l ?i?n t?. N?u qu v? c?n gip ?? ?? cai thu?c, hy h?i chuyn gia ch?m Bobby Bobby [name] s?c  Bobby [name]. Thu?c  Ch? s? d?ng thu?c khng k ??n v thu?c k ??n theo ch? d?n c?a chuyn gia ch?m Bobby [name] s?c Bobby [name].  Khng s? d?ng thu?c ng? khng k ??n. Qu v? c th? b? ph? thu?c vo thu?c ny v c th? khi?n ng?ng th? khi ng? tr?m tr?ng h?n.  Khng s? d?ng thu?c, ch?ng h?n nh? thu?c an th?n v ch?t gy nghi?n, tr? khi ???c chuyn gia ch?m Bobby [name] s?c Bobby [name] ch? d?n. Ho?t ??ng  T?p th? d?c vo h?u h?t cc ngy, nh?ng trnh t?p th? d?c vo bu?i t?i. T?p th? d?c g?n gi? ?i ng? c th? ?nh h??ng ??n gi?c ng?.  N?u c th?, hy dnh th?i gian ngoi tr?i m?i ngy. nh sng t? nhin gip ?i?u ha nh?p sinh h?c c?a qu v?. Thng tin chung  Gi?m cn n?u qu v? c?n v duy tr cn n?ng t?t cho s?c Bobby [name].  Tun th? t?t c? cc l?n khm theo di theo ch? d?n c?a chuyn gia ch?m Bobby Bobby [name] s?c Bobby [name]. ?i?u ny c vai tr quan tr?ng.  N?u qu v? ???c lm ph?u thu?t, hy ??m b?o cho chuyn gia ch?m Bobby Bobby [name] s?c Bobby [name] bi?t r?ng qu v? b? ng?ng th? khi ng?. Qu v? c th? c?n mang theo thi?t b? c?a qu v?. N?i ?? tm thm thng tin Tm hi?u thm v? ng?ng th? khi ng? v m?t m?i ban ngy ?:  [name] (Hi?p h?i Gi?c ng? Hoa K?): sleepassociation.[name] [name]? Gi?c ng? Qu?c gia): sleepfoundation.org  National Heart, Lung, and Blood Institute (Vi?n Tim, Ph?i v Mu Qu?c gia): https://www.hartman-hill.biz/ Tm t?t  Ng?ng th? ng? c th? gy m?t m?i ban ngy v cc tnh tr?ng s?c Bobby [name] nghim tr?ng khc.  C? ng?ng th? khi ng? v m?t m?i ban ngy c th? khng t?t cho s?c Bobby [name] v h?nh phc c?a qu v?.  Qu v? c th? c?n ph?i mang m?t thi?t  b? khi ng? gip gi? cho ???ng th? khai thng.  N?u qu v? ???c lm ph?u thu?t, hy ??m b?o cho chuyn gia ch?m Bobby Bobby [name] bi?t r?ng qu v? b? ng?ng th? khi ng?. Qu v? c th? c?n mang theo thi?t b? c?a qu v?.  Thay ??i thi quen ng?, ch? ?? ?n, l?i s?ng v ho?t ??ng c th? gip qu v? x? tr ng?ng th? khi ng?Bobby [name] Kitchen Thng tin ny khng nh?m m?c ?ch thay th? cho l?i khuyn m  chuyn gia ch?m Bobby Bobby [name] ni v?i qu v?. Hy b?o ??m qu v? ph?i th?o lu?n b?t k? v?n ?? g m qu v? c v?i chuyn gia ch?m Bobby Bobby [name] c?a qu v?. Document Revised: 05/22/2018 Document Reviewed: 05/22/2018 Elsevier Patient Education  Bobby [name]. Hypersomnia Hypersomnia is a condition in which a person feels very tired during the day even though he or she gets plenty of sleep at night. A person with this condition may take naps during the day and may find it very difficult to wake up from sleep. Hypersomnia may affect a person's ability to think, concentrate, drive, or remember things. What are the causes? The cause of this condition may not be known. Possible causes include:  Certain medicines.  Sleep disorders, such as narcolepsy and sleep apnea.  Injury to the head, brain, or spinal cord.  Drug or alcohol use.  Gastroesophageal reflux disease (GERD).  Tumors.  Certain medical conditions, such as depression, diabetes, or an underactive thyroid gland (hypothyroidism). What are the signs or symptoms? The main symptoms of hypersomnia include:  Feeling very tired throughout the day, regardless of how much sleep you got the night before.  Having trouble waking up. Others may find it difficult to wake you up when you are sleeping.  Sleeping for longer and longer periods at a time.  Taking naps throughout the day. Other symptoms may include:  Feeling restless, anxious, or annoyed.  Lacking energy.  Having trouble with: ? Remembering. ? Speaking. ? Thinking.  Loss of appetite.  Seeing, hearing, tasting, smelling, or feeling things that are not real (hallucinations). How is this diagnosed? This condition may be diagnosed based on:  Your symptoms and medical history.  Your sleeping habits. Your health care provider may ask you to write down your sleeping habits in a daily sleep log, along with any symptoms you have.  A series of tests that are done while you  sleep (sleep study or polysomnogram).  A test that measures how quickly you can fall asleep during the day (daytime nap study or multiple sleep latency test). How is this treated? Treatment can help you manage your condition. Treatment may include:  Following a regular sleep routine.  Lifestyle changes, such as changing your eating habits, getting regular exercise, and avoiding alcohol or caffeinated beverages.  Taking medicines to make you more alert (stimulants) during the day.  Treating any underlying medical causes of hypersomnia. Follow these instructions at home: Sleep routine   Schedule the same bedtime and wake-up time each day.  Practice a relaxing bedtime routine. This may include reading, meditation, deep breathing, or taking a warm bath before going to sleep.  Get regular exercise each day. Avoid strenuous exercise in the evening hours.  Keep your sleep environment at a cooler temperature, darkened, and quiet.  Sleep with pillows and a mattress that are comfortable and supportive.  Schedule short 20-minute naps for when you feel sleepiest during the day.  Talk with your  employer or teachers about your hypersomnia. If possible, adjust your schedule so that: ? You have a regular daytime work schedule. ? You can take a scheduled nap during the day. ? You do not have to work or be active at night.  Do not eat a heavy meal for a few hours before bedtime. Eat your meals at about the same times every day.  Avoid drinking alcohol or caffeinated beverages. Safety   Do not drive or use heavy machinery if you are sleepy. Ask your health care provider if it is safe for you to drive.  Wear a life jacket when swimming or spending time near water. General instructions  Take supplements and over-the-counter and prescription medicines only as told by your health care provider.  Keep a sleep log that will help your doctor manage your condition. This may include information  about: ? What time you go to bed each night. ? How often you wake up at night. ? How many hours you sleep at night. ? How often and for how long you nap during the day. ? Any observations from others, such as leg movements during sleep, sleep walking, or snoring.  Keep all follow-up visits as told by your health care provider. This is important. Contact a health care provider if:  You have new symptoms.  Your symptoms get worse. Get help right away if:  You have serious thoughts about hurting yourself or someone else. If you ever feel like you may hurt yourself or others, or have thoughts about taking your own life, get help right away. You can go to your nearest emergency department or call:  Your local emergency services (911 in the [place]  A suicide crisis helpline, such as the [facility] at 770-647-9568. This is open 24 hours a day. Summary  Hypersomnia refers to a condition in which you feel very tired during the day even though you get plenty of sleep at night.  A person with this condition may take naps during the day and may find it very difficult to wake up from sleep.  Hypersomnia may affect a person's ability to think, concentrate, drive, or remember things.  Treatment, such as following a regular sleep routine and making some lifestyle changes, can help you manage your condition. This information is not intended to replace advice given to you by your health care provider. Make sure you discuss any questions you have with your health care provider. Document Revised: 08/24/2017 Document Reviewed: 08/24/2017 Elsevier Patient Education  2020 [employer].

## 2020-08-17 ENCOUNTER — Ambulatory Visit (INDEPENDENT_AMBULATORY_CARE_PROVIDER_SITE_OTHER): Payer: PPO | Admitting: Neurology

## 2020-08-17 DIAGNOSIS — R0683 Snoring: Secondary | ICD-10-CM

## 2020-08-17 DIAGNOSIS — G478 Other sleep disorders: Secondary | ICD-10-CM

## 2020-08-17 DIAGNOSIS — G4719 Other hypersomnia: Secondary | ICD-10-CM

## 2020-08-17 DIAGNOSIS — G4733 Obstructive sleep apnea (adult) (pediatric): Secondary | ICD-10-CM | POA: Diagnosis not present

## 2020-08-17 DIAGNOSIS — G4726 Circadian rhythm sleep disorder, shift work type: Secondary | ICD-10-CM
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
## 2020-09-02 ENCOUNTER — Telehealth: Payer: Self-pay | Admitting: Neurology

## 2020-09-02 DIAGNOSIS — R0683 Snoring: Secondary | ICD-10-CM | POA: Insufficient documentation

## 2020-09-02 DIAGNOSIS — G4726 Circadian rhythm sleep disorder, shift work type: Secondary | ICD-10-CM | POA: Insufficient documentation

## 2020-09-02 DIAGNOSIS — G4719 Other hypersomnia: Secondary | ICD-10-CM | POA: Insufficient documentation

## 2020-09-02 DIAGNOSIS — G478 Other sleep disorders: Secondary | ICD-10-CM | POA: Insufficient documentation

## 2020-09-02 NOTE — Addendum Note (Signed)
Addended by: Melvyn Novas on: 09/02/2020 01:55 PM   Modules accepted: Orders

## 2020-09-02 NOTE — Progress Notes (Signed)
IMPRESSION: OSA (obstructive sleep apnea), at a mild -moderate degree of AHI 14.9/h and associated with REM sleep accentuation to AHI of 29.3/h, also found were minor hypoxemia and normal pulse range.    RECOMMENDATION: Auto CPAP therapy with heated humidification is recommended for REM dependent sleep apneas, with a setting from 6-16 cm water, 3 cm EPR and mask to be fitted. Please consider dental status when fitting for mask.    Please have patient follow up with NP within 60-90 days of CPAP use and explain CPAP compliance to the patient.  He needs to be advised to call DME or [facility] if he cannot use CPAP for any reason to avoid being deemed a non-compliant patient.   Please add new PCP -

## 2020-09-02 NOTE — Telephone Encounter (Signed)
Called the interpretor services to attempt to reach out to the patient. Called the pt number listed as well as attempted the wife's number since DPR said ok to LVM on her number. Both phones did not have VM set up. Will attempt again later.

## 2020-09-02 NOTE — Telephone Encounter (Signed)
-----   Message from Melvyn Novas, MD sent at 09/02/2020  1:55 PM EST ----- IMPRESSION: OSA (obstructive sleep apnea), at a mild -moderate degree of AHI 14.9/h and associated with REM sleep accentuation to AHI of 29.3/h, also found were minor hypoxemia and normal pulse range.    RECOMMENDATION: Auto CPAP therapy with heated humidification is recommended for REM dependent sleep apneas, with a setting from 6-16 cm water, 3 cm EPR and mask to be fitted. Please consider dental status when fitting for mask.    Please have patient follow up with NP within 60-90 days of CPAP use and explain CPAP compliance to the patient.  He needs to be advised to call DME or [facility] if he cannot use CPAP for any reason to avoid being deemed a non-compliant patient.   Please add new PCP -

## 2020-09-07 ENCOUNTER — Encounter: Payer: Self-pay | Admitting: Registered Nurse

## 2020-09-07 ENCOUNTER — Telehealth (INDEPENDENT_AMBULATORY_CARE_PROVIDER_SITE_OTHER): Payer: PPO | Admitting: Registered Nurse

## 2020-09-07 ENCOUNTER — Other Ambulatory Visit: Payer: Self-pay

## 2020-09-07 DIAGNOSIS — R972 Elevated prostate specific antigen [PSA]: Secondary | ICD-10-CM | POA: Insufficient documentation

## 2020-09-07 DIAGNOSIS — K219 Gastro-esophageal reflux disease without esophagitis: Secondary | ICD-10-CM | POA: Diagnosis not present

## 2020-09-07 DIAGNOSIS — Z7689 Persons encountering health services in other specified circumstances: Secondary | ICD-10-CM | POA: Diagnosis not present

## 2020-09-07 DIAGNOSIS — I10 Essential (primary) hypertension: Secondary | ICD-10-CM | POA: Diagnosis not present

## 2020-09-07 DIAGNOSIS — R059 Cough, unspecified: Secondary | ICD-10-CM

## 2020-09-07 MED ORDER — TAMSULOSIN HCL 0.4 MG PO CAPS: 0.4000 mg | ORAL_CAPSULE | Freq: Two times a day (BID) | ORAL | 2 refills | Status: DC

## 2020-09-07 MED ORDER — LISINOPRIL-HYDROCHLOROTHIAZIDE 10-12.5 MG PO TABS: 1.0000 | ORAL_TABLET | Freq: Every day | ORAL | 1 refills | Status: DC

## 2020-09-07 MED ORDER — OMEPRAZOLE 20 MG PO CPDR: 20.0000 mg | DELAYED_RELEASE_CAPSULE | Freq: Two times a day (BID) | ORAL | 3 refills | Status: DC
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
## 2020-09-09 LAB — COVID-19, FLU A+B AND RSV
Influenza A, NAA: NOT DETECTED
Influenza B, NAA: NOT DETECTED
RSV, NAA: NOT DETECTED
SARS-CoV-2, NAA: DETECTED — AB

## 2020-09-10 ENCOUNTER — Telehealth: Payer: Self-pay | Admitting: Registered Nurse

## 2020-09-10 NOTE — Telephone Encounter (Signed)
Called the interpretor line and was able to use a vietnamese interpretor. I reviewed the sleep study results with him. Advised a order would be sent for him to a DME company to get him set up with a auto CPAP. A total of 30  Minutes was spent on the phone and the pt's main concern is cost. He states that if he will have to pay for the machine he will not be able to move forward with treatment. I advised that we could send the order to the DME company and they will process the insurance. They will be able to tell him cost that will be associated with the machine at that time. Pt verbalized understanding.

## 2020-09-15 NOTE — Telephone Encounter (Signed)
Patient message below. Do we need to schedule this patient for a virtual visit? Or can we address this by message?

## 2021-02-25 ENCOUNTER — Encounter: Payer: Self-pay | Admitting: Registered Nurse

## 2021-02-25 ENCOUNTER — Other Ambulatory Visit: Payer: Self-pay

## 2021-02-25 ENCOUNTER — Ambulatory Visit (INDEPENDENT_AMBULATORY_CARE_PROVIDER_SITE_OTHER): Payer: PPO | Admitting: Registered Nurse

## 2021-02-25 VITALS — BP 133/64 | HR 65 | Temp 98.2°F | Resp 18 | Ht 69.0 in | Wt 195.0 lb

## 2021-02-25 DIAGNOSIS — M25561 Pain in right knee: Secondary | ICD-10-CM

## 2021-02-25 DIAGNOSIS — M7712 Lateral epicondylitis, left elbow: Secondary | ICD-10-CM

## 2021-02-25 DIAGNOSIS — G8929 Other chronic pain: Secondary | ICD-10-CM

## 2021-02-25 DIAGNOSIS — M25562 Pain in left knee: Secondary | ICD-10-CM

## 2021-02-25 DIAGNOSIS — R972 Elevated prostate specific antigen [PSA]: Secondary | ICD-10-CM

## 2021-02-25 MED ORDER — DICLOFENAC SODIUM 1 % EX GEL: 2.0000 g | Freq: Four times a day (QID) | CUTANEOUS | 3 refills | Status: DC

## 2021-02-25 MED ORDER — FINASTERIDE 5 MG PO TABS: 5.0000 mg | ORAL_TABLET | Freq: Every day | ORAL | 1 refills | Status: DC
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
## 2021-02-25 NOTE — Patient Instructions (Signed)
Bobby [name] -   Doristine Devoid to see you. It's been a pleasure caring for you in the short time we had spent together - Good luck with Dr. Mitchel Honour! He's great.  I think your knee numbness and instability is osteoarthritis (OA). OA is "wear and tear" on joints. Xrays will confirm this.  Your elbow pain seems to be lateral epicondylitis (tennis elbow). Tennis elbow is worsened by repetitive motions. Using diclofenac on the affected area up to four times daily as needed  If symptoms worsen or fail to improve, please let myself or Dr. Mitchel Honour know  Thank you  Rich

## 2021-03-06 ENCOUNTER — Other Ambulatory Visit: Payer: Self-pay

## 2021-03-06 ENCOUNTER — Encounter (HOSPITAL_COMMUNITY): Payer: Self-pay | Admitting: *Deleted

## 2021-03-06 ENCOUNTER — Emergency Department (HOSPITAL_COMMUNITY)
Admission: EM | Admit: 2021-03-06 | Discharge: 2021-03-06 | Disposition: A | Discharge status: Home / Self Care | Payer: PPO | Attending: Emergency Medicine | Admitting: Emergency Medicine

## 2021-03-06 DIAGNOSIS — F17210 Nicotine dependence, cigarettes, uncomplicated: Secondary | ICD-10-CM | POA: Diagnosis not present

## 2021-03-06 DIAGNOSIS — Z79899 Other long term (current) drug therapy: Secondary | ICD-10-CM | POA: Insufficient documentation

## 2021-03-06 DIAGNOSIS — R339 Retention of urine, unspecified: Secondary | ICD-10-CM

## 2021-03-06 DIAGNOSIS — I10 Essential (primary) hypertension: Secondary | ICD-10-CM | POA: Diagnosis not present

## 2021-03-06 LAB — URINALYSIS, ROUTINE W REFLEX MICROSCOPIC
Bacteria, UA: NONE SEEN
Bilirubin Urine: NEGATIVE
Glucose, UA: NEGATIVE mg/dL
Hgb urine dipstick: NEGATIVE
Ketones, ur: NEGATIVE mg/dL
Leukocytes,Ua: NEGATIVE
Nitrite: NEGATIVE
Protein, ur: NEGATIVE mg/dL
Specific Gravity, Urine: 1.009 (ref 1.005–1.030)
pH: 5 (ref 5.0–8.0)

## 2021-03-06 LAB — COMPREHENSIVE METABOLIC PANEL
ALT: 29 U/L (ref 0–44)
AST: 21 U/L (ref 15–41)
Albumin: 3.7 g/dL (ref 3.5–5.0)
Alkaline Phosphatase: 70 U/L (ref 38–126)
Anion gap: 8 (ref 5–15)
BUN: 19 mg/dL (ref 8–23)
CO2: 22 mmol/L (ref 22–32)
Calcium: 8.7 mg/dL — ABNORMAL LOW (ref 8.9–10.3)
Chloride: 105 mmol/L (ref 98–111)
Creatinine, Ser: 1.11 mg/dL (ref 0.61–1.24)
GFR, Estimated: >60 mL/min (ref >60)
Glucose, Bld: 120 mg/dL — ABNORMAL HIGH (ref 70–99)
Potassium: 3.5 mmol/L (ref 3.5–5.1)
Sodium: 135 mmol/L (ref 135–145)
Total Bilirubin: 0.6 mg/dL (ref 0.3–1.2)
Total Protein: 7.3 g/dL (ref 6.5–8.1)

## 2021-03-06 LAB — CBC
HCT: 32.9 % — ABNORMAL LOW (ref 39.0–52.0)
Hemoglobin: 11.1 g/dL — ABNORMAL LOW (ref 13.0–17.0)
MCH: 21.1 pg — ABNORMAL LOW (ref 26.0–34.0)
MCHC: 33.7 g/dL (ref 30.0–36.0)
MCV: 62.4 fL — ABNORMAL LOW (ref 80.0–100.0)
Platelets: 130 K/uL — ABNORMAL LOW (ref 150–400)
RBC: 5.27 MIL/uL (ref 4.22–5.81)
RDW: 16.1 % — ABNORMAL HIGH (ref 11.5–15.5)
WBC: 6.6 K/uL (ref 4.0–10.5)
nRBC: 0 % (ref 0.0–0.2)

## 2021-03-06 MED ORDER — LIDOCAINE HCL URETHRAL/MUCOSAL 2 % EX GEL
1.0000 "application " | Freq: Once | CUTANEOUS | Status: AC
  Administered 2021-03-06: 1 via URETHRAL
  Filled 2021-03-06: qty 11

## 2021-03-06 MED ORDER — OXYCODONE-ACETAMINOPHEN 5-325 MG PO TABS
1.0000 | ORAL_TABLET | Freq: Once | ORAL | Status: AC
  Administered 2021-03-06: 1 via ORAL
  Filled 2021-03-06: qty 1

## 2021-03-06 NOTE — ED Triage Notes (Signed)
The pt reports that he has been unable to empty his bladder since around 2100 he has had this difficulty previously and has had   a catheter

## 2021-03-06 NOTE — Discharge Instructions (Addendum)
Leave catheter in place. Follow up with urology as soon as possible, can call for appt (office may be closed Monday due to holiday) Return here for new concerns.
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
## 2021-03-06 NOTE — ED Notes (Signed)
This RN went in to bladder scan pt. Pt up and urinating in urinal. Able to get out 121mL - bladder scan showing ~43mL

## 2021-03-06 NOTE — ED Notes (Signed)
Patient verbalizes understanding of discharge instructions. Opportunity for questioning and answers were provided. Armband removed by staff, pt discharged from ED via wheelchair.  

## 2021-03-06 NOTE — ED Notes (Signed)
Pt foley switched to leg strap and leg bag. Pt instructed on how to drain foley. Pt wife taking home

## 2021-03-07 LAB — URINE CULTURE: Culture: NO GROWTH

## 2021-03-12 DIAGNOSIS — R3914 Feeling of incomplete bladder emptying: Secondary | ICD-10-CM | POA: Diagnosis not present

## 2021-03-12 DIAGNOSIS — R3912 Poor urinary stream: Secondary | ICD-10-CM | POA: Diagnosis not present

## 2021-03-12 DIAGNOSIS — Z125 Encounter for screening for malignant neoplasm of prostate: Secondary | ICD-10-CM | POA: Diagnosis not present

## 2021-03-25 DIAGNOSIS — R3914 Feeling of incomplete bladder emptying: Secondary | ICD-10-CM | POA: Diagnosis not present

## 2021-04-05 DIAGNOSIS — R350 Frequency of micturition: Secondary | ICD-10-CM | POA: Diagnosis not present

## 2021-04-05 DIAGNOSIS — R3914 Feeling of incomplete bladder emptying: Secondary | ICD-10-CM | POA: Diagnosis not present

## 2021-04-05 DIAGNOSIS — R972 Elevated prostate specific antigen [PSA]: Secondary | ICD-10-CM | POA: Diagnosis not present

## 2021-04-07 ENCOUNTER — Other Ambulatory Visit: Payer: Self-pay | Admitting: Registered Nurse

## 2021-04-07 DIAGNOSIS — I10 Essential (primary) hypertension: Secondary | ICD-10-CM

## 2021-04-23 DIAGNOSIS — R972 Elevated prostate specific antigen [PSA]: Secondary | ICD-10-CM | POA: Diagnosis not present

## 2021-04-30 DIAGNOSIS — N401 Enlarged prostate with lower urinary tract symptoms: Secondary | ICD-10-CM | POA: Diagnosis not present

## 2021-04-30 DIAGNOSIS — R972 Elevated prostate specific antigen [PSA]: Secondary | ICD-10-CM | POA: Diagnosis not present

## 2021-04-30 DIAGNOSIS — R3914 Feeling of incomplete bladder emptying: Secondary | ICD-10-CM | POA: Diagnosis not present

## 2021-04-30 DIAGNOSIS — R351 Nocturia: Secondary | ICD-10-CM | POA: Diagnosis not present

## 2021-05-08 ENCOUNTER — Other Ambulatory Visit: Payer: Self-pay | Admitting: Registered Nurse

## 2021-05-08 DIAGNOSIS — I10 Essential (primary) hypertension: Secondary | ICD-10-CM

## 2021-06-04 ENCOUNTER — Other Ambulatory Visit: Payer: Self-pay | Admitting: Registered Nurse

## 2021-06-04 DIAGNOSIS — I10 Essential (primary) hypertension: Secondary | ICD-10-CM

## 2021-06-06 ENCOUNTER — Other Ambulatory Visit: Payer: Self-pay | Admitting: Registered Nurse

## 2021-06-06 DIAGNOSIS — R972 Elevated prostate specific antigen [PSA]: Secondary | ICD-10-CM

## 2021-07-08 ENCOUNTER — Other Ambulatory Visit: Payer: Self-pay | Admitting: Registered Nurse

## 2021-07-08 DIAGNOSIS — I10 Essential (primary) hypertension: Secondary | ICD-10-CM

## 2021-10-12 ENCOUNTER — Other Ambulatory Visit: Payer: Self-pay | Admitting: Registered Nurse

## 2021-10-12 DIAGNOSIS — I10 Essential (primary) hypertension: Secondary | ICD-10-CM

## 2022-04-21 ENCOUNTER — Ambulatory Visit (INDEPENDENT_AMBULATORY_CARE_PROVIDER_SITE_OTHER): Payer: PPO | Admitting: Orthopedic Surgery

## 2022-04-21 ENCOUNTER — Encounter: Payer: Self-pay | Admitting: Orthopedic Surgery

## 2022-04-21 VITALS — BP 158/100 | HR 65 | Temp 96.9°F | Resp 20 | Ht 69.0 in | Wt 198.0 lb

## 2022-04-21 DIAGNOSIS — R972 Elevated prostate specific antigen [PSA]: Secondary | ICD-10-CM | POA: Diagnosis not present

## 2022-04-21 DIAGNOSIS — G4733 Obstructive sleep apnea (adult) (pediatric): Secondary | ICD-10-CM | POA: Diagnosis not present

## 2022-04-21 DIAGNOSIS — R911 Solitary pulmonary nodule: Secondary | ICD-10-CM

## 2022-04-21 DIAGNOSIS — M150 Primary generalized (osteo)arthritis: Secondary | ICD-10-CM

## 2022-04-21 DIAGNOSIS — K219 Gastro-esophageal reflux disease without esophagitis: Secondary | ICD-10-CM | POA: Diagnosis not present

## 2022-04-21 DIAGNOSIS — I10 Essential (primary) hypertension: Secondary | ICD-10-CM

## 2022-04-21 DIAGNOSIS — Z87891 Personal history of nicotine dependence: Secondary | ICD-10-CM | POA: Insufficient documentation

## 2022-04-21 DIAGNOSIS — M159 Polyosteoarthritis, unspecified: Secondary | ICD-10-CM

## 2022-04-21 MED ORDER — FINASTERIDE 5 MG PO TABS: 5.0000 mg | ORAL_TABLET | Freq: Every day | ORAL | 2 refills | Status: DC

## 2022-04-21 MED ORDER — OMEPRAZOLE 20 MG PO CPDR: 20.0000 mg | DELAYED_RELEASE_CAPSULE | Freq: Two times a day (BID) | ORAL | 3 refills | Status: DC

## 2022-04-21 MED ORDER — LISINOPRIL-HYDROCHLOROTHIAZIDE 10-12.5 MG PO TABS: 1.0000 | ORAL_TABLET | Freq: Every day | ORAL | 2 refills | Status: DC

## 2022-04-21 MED ORDER — TAMSULOSIN HCL 0.4 MG PO CAPS: 0.4000 mg | ORAL_CAPSULE | Freq: Two times a day (BID) | ORAL | 2 refills | Status: AC

## 2022-04-21 NOTE — Patient Instructions (Addendum)
Please schedule yearly follow up with Dr. Gloriann Loan  Plan to do lab work next visit- you can only have water before visit
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
## 2022-05-26 ENCOUNTER — Other Ambulatory Visit: Payer: PPO

## 2022-05-26 ENCOUNTER — Other Ambulatory Visit: Payer: Self-pay | Admitting: Orthopedic Surgery

## 2022-05-26 DIAGNOSIS — Z1159 Encounter for screening for other viral diseases: Secondary | ICD-10-CM

## 2022-05-26 DIAGNOSIS — I10 Essential (primary) hypertension: Secondary | ICD-10-CM

## 2022-05-26 DIAGNOSIS — K219 Gastro-esophageal reflux disease without esophagitis: Secondary | ICD-10-CM

## 2022-05-27 LAB — CBC WITH DIFFERENTIAL/PLATELET
Absolute Monocytes: 317 cells/uL (ref 200–950)
Basophils Absolute: 32 cells/uL (ref 0–200)
Basophils Relative: 0.7 %
Eosinophils Absolute: 152 cells/uL (ref 15–500)
Eosinophils Relative: 3.3 %
HCT: 35.9 % — ABNORMAL LOW (ref 38.5–50.0)
Hemoglobin: 11.5 g/dL — ABNORMAL LOW (ref 13.2–17.1)
Lymphs Abs: 1366 cells/uL (ref 850–3900)
MCH: 20.5 pg — ABNORMAL LOW (ref 27.0–33.0)
MCHC: 32 g/dL (ref 32.0–36.0)
MCV: 64 fL — ABNORMAL LOW (ref 80.0–100.0)
Monocytes Relative: 6.9 %
Neutro Abs: 2732 cells/uL (ref 1500–7800)
Neutrophils Relative %: 59.4 %
Platelets: 135 10*3/uL — ABNORMAL LOW (ref 140–400)
RBC: 5.61 10*6/uL (ref 4.20–5.80)
RDW: 18.5 % — ABNORMAL HIGH (ref 11.0–15.0)
Total Lymphocyte: 29.7 %
WBC: 4.6 10*3/uL (ref 3.8–10.8)

## 2022-05-27 LAB — COMPLETE METABOLIC PANEL WITH GFR
AG Ratio: 1.3 (calc) (ref 1.0–2.5)
ALT: 32 U/L (ref 9–46)
AST: 21 U/L (ref 10–35)
Albumin: 4.4 g/dL (ref 3.6–5.1)
Alkaline phosphatase (APISO): 81 U/L (ref 35–144)
BUN: 17 mg/dL (ref 7–25)
CO2: 26 mmol/L (ref 20–32)
Calcium: 9.3 mg/dL (ref 8.6–10.3)
Chloride: 103 mmol/L (ref 98–110)
Creat: 1.05 mg/dL (ref 0.70–1.28)
Globulin: 3.3 g/dL (calc) (ref 1.9–3.7)
Glucose, Bld: 91 mg/dL (ref 65–99)
Potassium: 4.6 mmol/L (ref 3.5–5.3)
Sodium: 139 mmol/L (ref 135–146)
Total Bilirubin: 0.9 mg/dL (ref 0.2–1.2)
Total Protein: 7.7 g/dL (ref 6.1–8.1)
eGFR: 76 mL/min/{1.73_m2} (ref >=60)

## 2022-05-27 LAB — HEPATITIS C ANTIBODY: Hepatitis C Ab: NONREACTIVE

## 2022-05-29 ENCOUNTER — Other Ambulatory Visit: Payer: Self-pay

## 2022-05-29 ENCOUNTER — Emergency Department (HOSPITAL_COMMUNITY): Payer: PPO

## 2022-05-29 ENCOUNTER — Encounter (HOSPITAL_COMMUNITY): Payer: Self-pay

## 2022-05-29 ENCOUNTER — Emergency Department (HOSPITAL_COMMUNITY)
Admission: EM | Admit: 2022-05-29 | Discharge: 2022-05-29 | Disposition: A | Discharge status: Home / Self Care | Payer: PPO | Attending: Emergency Medicine | Admitting: Emergency Medicine

## 2022-05-29 DIAGNOSIS — I10 Essential (primary) hypertension: Secondary | ICD-10-CM | POA: Insufficient documentation

## 2022-05-29 DIAGNOSIS — Z87891 Personal history of nicotine dependence: Secondary | ICD-10-CM | POA: Diagnosis not present

## 2022-05-29 DIAGNOSIS — Z79899 Other long term (current) drug therapy: Secondary | ICD-10-CM | POA: Insufficient documentation

## 2022-05-29 DIAGNOSIS — R1031 Right lower quadrant pain: Secondary | ICD-10-CM | POA: Insufficient documentation

## 2022-05-29 LAB — COMPREHENSIVE METABOLIC PANEL
ALT: 32 U/L (ref 0–44)
AST: 33 U/L (ref 15–41)
Albumin: 3.7 g/dL (ref 3.5–5.0)
Alkaline Phosphatase: 69 U/L (ref 38–126)
Anion gap: 7 (ref 5–15)
BUN: 17 mg/dL (ref 8–23)
CO2: 25 mmol/L (ref 22–32)
Calcium: 8.8 mg/dL — ABNORMAL LOW (ref 8.9–10.3)
Chloride: 104 mmol/L (ref 98–111)
Creatinine, Ser: 1.09 mg/dL (ref 0.61–1.24)
GFR, Estimated: >60 mL/min (ref >60)
Glucose, Bld: 141 mg/dL — ABNORMAL HIGH (ref 70–99)
Potassium: 4.4 mmol/L (ref 3.5–5.1)
Sodium: 136 mmol/L (ref 135–145)
Total Bilirubin: 0.8 mg/dL (ref 0.3–1.2)
Total Protein: 7.2 g/dL (ref 6.5–8.1)

## 2022-05-29 LAB — URINALYSIS, ROUTINE W REFLEX MICROSCOPIC
Bilirubin Urine: NEGATIVE
Glucose, UA: NEGATIVE mg/dL
Hgb urine dipstick: NEGATIVE
Ketones, ur: NEGATIVE mg/dL
Leukocytes,Ua: NEGATIVE
Nitrite: NEGATIVE
Protein, ur: NEGATIVE mg/dL
Specific Gravity, Urine: 1.01 (ref 1.005–1.030)
pH: 7 (ref 5.0–8.0)

## 2022-05-29 LAB — CBC
HCT: 34.2 % — ABNORMAL LOW (ref 39.0–52.0)
Hemoglobin: 11.4 g/dL — ABNORMAL LOW (ref 13.0–17.0)
MCH: 20.7 pg — ABNORMAL LOW (ref 26.0–34.0)
MCHC: 33.3 g/dL (ref 30.0–36.0)
MCV: 62.1 fL — ABNORMAL LOW (ref 80.0–100.0)
Platelets: 110 10*3/uL — ABNORMAL LOW (ref 150–400)
RBC: 5.51 MIL/uL (ref 4.22–5.81)
RDW: 16.3 % — ABNORMAL HIGH (ref 11.5–15.5)
WBC: 5.1 10*3/uL (ref 4.0–10.5)
nRBC: 0 % (ref 0.0–0.2)

## 2022-05-29 LAB — LIPASE, BLOOD: Lipase: 31 U/L (ref 11–51)

## 2022-05-29 MED ORDER — IOHEXOL 350 MG/ML SOLN
75.0000 mL | Freq: Once | INTRAVENOUS | Status: AC | PRN
  Administered 2022-05-29: 75 mL via INTRAVENOUS

## 2022-05-29 MED ORDER — OXYCODONE HCL 5 MG PO TABS
5.0000 mg | ORAL_TABLET | Freq: Once | ORAL | Status: AC
  Administered 2022-05-29: 5 mg via ORAL
  Filled 2022-05-29: qty 1

## 2022-05-29 MED ORDER — MORPHINE SULFATE 15 MG PO TABS: 7.5000 mg | ORAL_TABLET | ORAL | 0 refills | Status: DC | PRN

## 2022-05-29 MED ORDER — ACETAMINOPHEN 500 MG PO TABS
1000.0000 mg | ORAL_TABLET | Freq: Once | ORAL | Status: AC
  Administered 2022-05-29: 1000 mg via ORAL
  Filled 2022-05-29: qty 2

## 2022-05-29 MED ORDER — KETOROLAC TROMETHAMINE 15 MG/ML IJ SOLN
15.0000 mg | Freq: Once | INTRAMUSCULAR | Status: AC
  Administered 2022-05-29: 15 mg via INTRAVENOUS
  Filled 2022-05-29: qty 1

## 2022-05-29 NOTE — ED Notes (Signed)
Patient verbalizes understanding of discharge instructions. Opportunity for questioning and answers were provided. Armband removed by staff, pt discharged from ED. Pt ambulatory to ED waiting room with steady gait.  

## 2022-05-29 NOTE — ED Triage Notes (Signed)
Patient complains of right side pain with radiation to right flank x 1 week, pain worse with ambulation. Denies dysuria.

## 2022-05-29 NOTE — Discharge Instructions (Addendum)
Your CT scan read that you might have a partial bowel obstruction.  It is not obvious that this was going on with you clinically.  Please return to the Emergency Department for worsening pain fever or inability eat or drink.  Please follow-up with your family doctor in the office. Take 4 over the counter ibuprofen tablets 3 times a day or 2 over-the-counter naproxen tablets twice a day for pain. Also take tylenol '1000mg'$ (2 extra strength) four times a day.   Then take the pain medicine if you feel like you need it. Narcotics do not help with the pain, they only make you care about it less.  You can become addicted to this, people may break into your house to steal it.  It will constipate you.  If you drive under the influence of this medicine you can get a DUI.

## 2022-05-29 NOTE — ED Provider Notes (Signed)
71 yo M with a chief complaint of right flank pain.  This has been going on for about a week.  Worsened in the past 24 hours.  No nausea no vomiting no diarrhea.  No urinary symptoms.  There is some concern for nephrolithiasis and so he is pending CT scan.  I received the patient in signout from Dr. Truett Mainland.   Patient CT scan is resulted and shows partial small bowel obstruction per radiology read.  He still having some pain points to the right flank, about the posterior axillary line.  No obvious focal abdominal pain.  He does tell me that he feels a little bit bloated.  We will discuss with general surgery.  Case was discussed with Dr. Kieth Brightly, he personally reviewed the patient's CT imaging.  Felt reasonable to trial discharge home with the patient was able to tolerate by mouth.  Patient was able to tolerate by mouth here without issue.  We will have him follow-up with his family doctor.  Return for any worsening or inability eat or drink.   Deno Etienne, DO 05/29/22 1715
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
## 2022-06-02 ENCOUNTER — Encounter: Payer: Self-pay | Admitting: Orthopedic Surgery

## 2022-06-02 ENCOUNTER — Ambulatory Visit (INDEPENDENT_AMBULATORY_CARE_PROVIDER_SITE_OTHER): Payer: PPO | Admitting: Orthopedic Surgery

## 2022-06-02 VITALS — BP 138/72 | HR 80 | Temp 97.9°F | Resp 17 | Ht 69.0 in | Wt 192.6 lb

## 2022-06-02 DIAGNOSIS — Z23 Encounter for immunization: Secondary | ICD-10-CM

## 2022-06-02 DIAGNOSIS — D649 Anemia, unspecified: Secondary | ICD-10-CM | POA: Diagnosis not present

## 2022-06-02 DIAGNOSIS — R911 Solitary pulmonary nodule: Secondary | ICD-10-CM | POA: Diagnosis not present

## 2022-06-02 DIAGNOSIS — K56600 Partial intestinal obstruction, unspecified as to cause: Secondary | ICD-10-CM

## 2022-06-02 MED ORDER — MORPHINE SULFATE 15 MG PO TABS: 7.5000 mg | ORAL_TABLET | ORAL | 0 refills | Status: DC | PRN
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
## 2022-06-02 NOTE — Patient Instructions (Addendum)
[phone]- please call to make appointment with Dr. Kieth Brightly  If right lower quadrant pain worsens or you cannot eat/drink - please report to ED  May take morphine for severe pain- add stool softener if stool become hard

## 2022-06-03 ENCOUNTER — Other Ambulatory Visit: Payer: Self-pay | Admitting: Orthopedic Surgery

## 2022-06-03 ENCOUNTER — Telehealth: Payer: Self-pay

## 2022-06-03 DIAGNOSIS — K56600 Partial intestinal obstruction, unspecified as to cause: Secondary | ICD-10-CM

## 2022-06-03 LAB — IRON,TIBC AND FERRITIN PANEL
%SAT: 43 % (calc) (ref 20–48)
Ferritin: 486 ng/mL — ABNORMAL HIGH (ref 24–380)
Iron: 132 ug/dL (ref 50–180)
TIBC: 310 mcg/dL (calc) (ref 250–425)

## 2022-06-03 NOTE — Telephone Encounter (Signed)
Patient called and stated that [facility] stated they need to speak with Amy Fargo,NP before appointment could be scheduled. Reached out to [place] stated a referral have to be placed for patient to get scheduled for appointment. The referral will have to be faxed to [phone] with the office note.

## 2022-06-03 NOTE — Telephone Encounter (Signed)
Urgent referral made

## 2022-06-03 NOTE — Progress Notes (Signed)
09/24 ED visit recommended he schedule with Dr. Kieth Brightly (general surgeon) at [phone]. When I saw him yesterday he had not called to make appointment. They are aware of his recent results and expect to see him in office. No further studies at this time. He needs to call them to schedule.

## 2022-06-03 NOTE — Telephone Encounter (Signed)
Referral printed and faxed to [facility].

## 2022-06-08 ENCOUNTER — Telehealth: Payer: Self-pay

## 2022-06-08 NOTE — Telephone Encounter (Signed)
Letter is pending for provider to review prior to printing.

## 2022-06-08 NOTE — Telephone Encounter (Signed)
That is fine. I can sign tomorrow when in office.

## 2022-06-08 NOTE — Telephone Encounter (Signed)
Patient called the office requesting a work note and when I attempted to clarify reason for request there was a break down in communication.   I informed patient that I will call interpreter services and call him back.  Call place to interpreter services, patient states he would like a work note to remain out of work until 06/20/2022 due to ongoing stomach pains. Patient denies diarrhea, constipation, or fever.   Patient states he will have surgery on 06/16/2022 for his stomach. Patient would like work note mailed to him if Amy agrees to extend.  Please advise

## 2022-06-09 NOTE — Telephone Encounter (Signed)
Letter mailed

## 2022-07-13 ENCOUNTER — Encounter: Payer: Self-pay | Admitting: Orthopedic Surgery

## 2022-07-14 ENCOUNTER — Encounter: Payer: Self-pay | Admitting: Orthopedic Surgery

## 2022-07-14 ENCOUNTER — Ambulatory Visit (INDEPENDENT_AMBULATORY_CARE_PROVIDER_SITE_OTHER): Payer: PPO | Admitting: Orthopedic Surgery

## 2022-07-14 VITALS — BP 136/82 | HR 67 | Temp 95.1°F | Ht 69.0 in | Wt 196.0 lb

## 2022-07-14 DIAGNOSIS — K219 Gastro-esophageal reflux disease without esophagitis: Secondary | ICD-10-CM

## 2022-07-14 DIAGNOSIS — G8929 Other chronic pain: Secondary | ICD-10-CM

## 2022-07-14 DIAGNOSIS — M545 Low back pain, unspecified: Secondary | ICD-10-CM | POA: Diagnosis not present

## 2022-07-14 DIAGNOSIS — I10 Essential (primary) hypertension: Secondary | ICD-10-CM

## 2022-07-14 DIAGNOSIS — K5903 Drug induced constipation: Secondary | ICD-10-CM
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
## 2022-07-14 NOTE — Patient Instructions (Addendum)
Magnesium citrate- please use if you have not had a bowel movement in 3 days  Do not take ibuprofen for back pain, tylenol is safe to take for pain  You may try Voltaren gel- its topical and you can purchase at local store- you may use 3-4 times a day  Stretch in the morning to help with back pain

## 2022-11-09 ENCOUNTER — Ambulatory Visit (INDEPENDENT_AMBULATORY_CARE_PROVIDER_SITE_OTHER): Payer: PPO | Admitting: Family

## 2022-11-09 ENCOUNTER — Encounter: Payer: Self-pay | Admitting: Family

## 2022-11-09 VITALS — BP 122/88 | HR 65 | Temp 97.3°F | Resp 18 | Ht 69.0 in | Wt 193.4 lb

## 2022-11-09 DIAGNOSIS — R972 Elevated prostate specific antigen [PSA]: Secondary | ICD-10-CM

## 2022-11-09 DIAGNOSIS — I10 Essential (primary) hypertension: Secondary | ICD-10-CM | POA: Diagnosis not present

## 2022-11-09 DIAGNOSIS — M542 Cervicalgia: Secondary | ICD-10-CM | POA: Diagnosis not present

## 2022-11-09 DIAGNOSIS — M25512 Pain in left shoulder: Secondary | ICD-10-CM | POA: Diagnosis not present

## 2022-11-09 DIAGNOSIS — R200 Anesthesia of skin: Secondary | ICD-10-CM | POA: Diagnosis not present

## 2022-11-09 MED ORDER — FINASTERIDE 5 MG PO TABS: 5.0000 mg | ORAL_TABLET | Freq: Every day | ORAL | 2 refills | Status: DC

## 2022-11-09 MED ORDER — METHOCARBAMOL 500 MG PO TABS: 500.0000 mg | ORAL_TABLET | Freq: Three times a day (TID) | ORAL | 0 refills | Status: DC | PRN

## 2022-11-09 MED ORDER — LISINOPRIL-HYDROCHLOROTHIAZIDE 10-12.5 MG PO TABS: 1.0000 | ORAL_TABLET | Freq: Every day | ORAL | 2 refills | Status: DC

## 2022-11-09 NOTE — Patient Instructions (Signed)
-   Please get neck and left shoulder X-ray at [place] at [facility] then will call you with results. - Notify provider if symptoms worsen
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
## 2022-11-10 ENCOUNTER — Other Ambulatory Visit: Payer: Self-pay | Admitting: Orthopedic Surgery

## 2022-11-10 DIAGNOSIS — I10 Essential (primary) hypertension: Secondary | ICD-10-CM

## 2022-11-17 ENCOUNTER — Encounter: Payer: Self-pay | Admitting: Orthopedic Surgery

## 2022-11-17 ENCOUNTER — Ambulatory Visit (INDEPENDENT_AMBULATORY_CARE_PROVIDER_SITE_OTHER): Payer: PPO | Admitting: Orthopedic Surgery

## 2022-11-17 VITALS — BP 130/70 | HR 71 | Temp 97.0°F | Resp 16 | Ht 69.0 in | Wt 193.8 lb

## 2022-11-17 DIAGNOSIS — Z122 Encounter for screening for malignant neoplasm of respiratory organs: Secondary | ICD-10-CM

## 2022-11-17 DIAGNOSIS — Z0000 Encounter for general adult medical examination without abnormal findings: Secondary | ICD-10-CM

## 2022-11-17 NOTE — Patient Instructions (Addendum)
  Bobby [name] , Thank you for taking time to come for your Medicare Wellness Visit. I appreciate your ongoing commitment to your health goals. Please review the following plan we discussed and let me know if I can assist you in the future.   These are the goals we discussed:  Goals      Maintain Mobility and Function     Evidence-based guidance:  Acknowledge and validate impact of pain, loss of strength and potential disfigurement (hand osteoarthritis) on mental health and daily life, such as social isolation, anxiety, depression, impaired sexual relationship and   injury from falls.  Anticipate referral to physical or occupational therapy for assessment, therapeutic exercise and recommendation for adaptive equipment or assistive devices; encourage participation.  Assess impact on ability to perform activities of daily living, as well as engage in sports and leisure events or requirements of work or school.  Provide anticipatory guidance and reassurance about the benefit of exercise to maintain function; acknowledge and normalize fear that exercise may worsen symptoms.  Encourage regular exercise, at least 10 minutes at a time for 45 minutes per week; consider yoga, water exercise and proprioceptive exercises; encourage use of wearable activity tracker to increase motivation and adherence.  Encourage maintenance or resumption of daily activities, including employment, as pain allows and with minimal exposure to trauma.  Assist patient to advocate for adaptations to the work environment.  Consider level of pain and function, gender, age, lifestyle, patient preference, quality of life, readiness and ?ocapacity to benefit? when recommending patients for orthopaedic surgery consultation.  Explore strategies, such as changes to medication regimen or activity that enables patient to anticipate and manage flare-ups that increase deconditioning and disability.  Explore patient preferences; encourage  exposure to a broader range of activities that have been avoided for fear of experiencing pain.  Identify barriers to participation in therapy or exercise, such as pain with activity, anticipated or imagined pain.  Monitor postoperative joint replacement or any preexisting joint replacement for ongoing pain and loss of function; provide social support and encouragement throughout recovery.   Notes:         This is a list of the screening recommended for you and due dates:  Health Maintenance  Topic Date Due   Zoster (Shingles) Vaccine (1 of 2) Never done   COVID-19 Vaccine (2 - 2023-24 season) 05/06/2022   Medicare Annual Wellness Visit  11/17/2023   DTaP/Tdap/Td vaccine (2 - Tdap) 04/08/2027   Colon Cancer Screening  03/19/2028   Pneumonia Vaccine  Completed   Flu Shot  Completed   Hepatitis C Screening: USPSTF Recommendation to screen - Ages 18-79 yo.  Completed   HPV Vaccine  Aged Out   Cologuard (Stool DNA test)  Discontinued   CT chest to rule out lung cancer recommended> [facility] will call you to schedule  Recommend contacting [facility] for ongoing abdominal pain- [phone]
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
## 2022-12-23 ENCOUNTER — Ambulatory Visit
Admission: RE | Admit: 2022-12-23 | Discharge: 2022-12-23 | Disposition: A | Discharge status: Home / Self Care | Payer: PPO | Source: Ambulatory Visit | Attending: Orthopedic Surgery | Admitting: Orthopedic Surgery

## 2022-12-23 DIAGNOSIS — Z87891 Personal history of nicotine dependence: Secondary | ICD-10-CM | POA: Diagnosis not present

## 2022-12-23 DIAGNOSIS — Z122 Encounter for screening for malignant neoplasm of respiratory organs: Secondary | ICD-10-CM

## 2023-01-12 ENCOUNTER — Ambulatory Visit: Payer: PPO | Admitting: Orthopedic Surgery

## 2023-01-23 ENCOUNTER — Other Ambulatory Visit: Payer: Self-pay | Admitting: Orthopedic Surgery

## 2023-01-23 DIAGNOSIS — I10 Essential (primary) hypertension: Secondary | ICD-10-CM

## 2023-02-05 ENCOUNTER — Other Ambulatory Visit: Payer: Self-pay | Admitting: Orthopedic Surgery

## 2023-02-05 DIAGNOSIS — I10 Essential (primary) hypertension: Secondary | ICD-10-CM

## 2023-02-09 ENCOUNTER — Encounter: Payer: Self-pay | Admitting: Orthopedic Surgery

## 2023-02-09 ENCOUNTER — Ambulatory Visit (INDEPENDENT_AMBULATORY_CARE_PROVIDER_SITE_OTHER): Payer: PPO | Admitting: Orthopedic Surgery

## 2023-02-09 VITALS — BP 142/88 | HR 76 | Temp 97.4°F | Resp 16 | Ht 69.0 in | Wt 200.0 lb

## 2023-02-09 DIAGNOSIS — I700 Atherosclerosis of aorta: Secondary | ICD-10-CM

## 2023-02-09 DIAGNOSIS — Z6829 Body mass index (BMI) 29.0-29.9, adult: Secondary | ICD-10-CM

## 2023-02-09 DIAGNOSIS — K219 Gastro-esophageal reflux disease without esophagitis: Secondary | ICD-10-CM

## 2023-02-09 DIAGNOSIS — R739 Hyperglycemia, unspecified: Secondary | ICD-10-CM | POA: Diagnosis not present

## 2023-02-09 DIAGNOSIS — I10 Essential (primary) hypertension: Secondary | ICD-10-CM

## 2023-02-09 DIAGNOSIS — R911 Solitary pulmonary nodule: Secondary | ICD-10-CM

## 2023-02-09 DIAGNOSIS — I728 Aneurysm of other specified arteries: Secondary | ICD-10-CM | POA: Diagnosis not present

## 2023-02-09 DIAGNOSIS — R140 Abdominal distension (gaseous): Secondary | ICD-10-CM | POA: Diagnosis not present

## 2023-02-09 DIAGNOSIS — R972 Elevated prostate specific antigen [PSA]: Secondary | ICD-10-CM | POA: Diagnosis not present

## 2023-02-09 LAB — CBC WITH DIFFERENTIAL/PLATELET
Absolute Monocytes: 388 cells/uL (ref 200–950)
Eosinophils Relative: 2.5 %
MCV: 65.4 fL — ABNORMAL LOW (ref 80.0–100.0)
Monocytes Relative: 7.6 %
Neutro Abs: 3244 cells/uL (ref 1500–7800)
RDW: 19.6 % — ABNORMAL HIGH (ref 11.0–15.0)

## 2023-02-09 MED ORDER — LISINOPRIL-HYDROCHLOROTHIAZIDE 10-12.5 MG PO TABS: 1.0000 | ORAL_TABLET | Freq: Every day | ORAL | 2 refills | Status: DC

## 2023-02-09 MED ORDER — SIMETHICONE 80 MG PO CHEW
80.0000 mg | CHEWABLE_TABLET | Freq: Four times a day (QID) | ORAL | 0 refills | Status: DC | PRN
Start: 2023-02-09 — End: 2023-08-17
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
## 2023-02-09 NOTE — Patient Instructions (Addendum)
Try Gas-X for bloating- chew one tablet before eating meal  Avoid carbonated drinks like soda  Try walking after eating meal  Blood pressure medication   Recommend scheduling with Dr. Modena Slater (urology) [phone] ([facility])  if urinary issues persist

## 2023-02-10 LAB — LIPID PANEL
Cholesterol: 155 mg/dL (ref <200)
HDL: 41 mg/dL (ref >=40)
LDL Cholesterol (Calc): 89 mg/dL (calc)
Non-HDL Cholesterol (Calc): 114 mg/dL (calc) (ref <130)
Total CHOL/HDL Ratio: 3.8 (calc) (ref <5.0)
Triglycerides: 146 mg/dL (ref <150)

## 2023-02-10 LAB — COMPLETE METABOLIC PANEL WITH GFR
AG Ratio: 1.4 (calc) (ref 1.0–2.5)
ALT: 32 U/L (ref 9–46)
AST: 30 U/L (ref 10–35)
Albumin: 4.4 g/dL (ref 3.6–5.1)
Alkaline phosphatase (APISO): 75 U/L (ref 35–144)
BUN: 15 mg/dL (ref 7–25)
CO2: 27 mmol/L (ref 20–32)
Calcium: 9.4 mg/dL (ref 8.6–10.3)
Chloride: 104 mmol/L (ref 98–110)
Creat: 1.13 mg/dL (ref 0.70–1.28)
Globulin: 3.2 g/dL (calc) (ref 1.9–3.7)
Glucose, Bld: 87 mg/dL (ref 65–99)
Potassium: 4.5 mmol/L (ref 3.5–5.3)
Sodium: 139 mmol/L (ref 135–146)
Total Bilirubin: 0.8 mg/dL (ref 0.2–1.2)
Total Protein: 7.6 g/dL (ref 6.1–8.1)
eGFR: 69 mL/min/{1.73_m2} (ref >=60)

## 2023-02-10 LAB — CBC WITH DIFFERENTIAL/PLATELET
Basophils Absolute: 31 cells/uL (ref 0–200)
Basophils Relative: 0.6 %
Eosinophils Absolute: 128 cells/uL (ref 15–500)
HCT: 37.1 % — ABNORMAL LOW (ref 38.5–50.0)
Hemoglobin: 11.5 g/dL — ABNORMAL LOW (ref 13.2–17.1)
Lymphs Abs: 1311 cells/uL (ref 850–3900)
MCH: 20.3 pg — ABNORMAL LOW (ref 27.0–33.0)
MCHC: 31 g/dL — ABNORMAL LOW (ref 32.0–36.0)
Neutrophils Relative %: 63.6 %
Platelets: 149 10*3/uL (ref 140–400)
RBC: 5.67 10*6/uL (ref 4.20–5.80)
Total Lymphocyte: 25.7 %
WBC: 5.1 10*3/uL (ref 3.8–10.8)

## 2023-02-10 LAB — HEMOGLOBIN A1C
Hgb A1c MFr Bld: 5.5 % of total Hgb (ref <5.7)
Mean Plasma Glucose: 111 mg/dL
eAG (mmol/L): 6.2 mmol/L

## 2023-02-10 LAB — CBC MORPHOLOGY

## 2023-04-07 ENCOUNTER — Other Ambulatory Visit: Payer: Self-pay | Admitting: Orthopedic Surgery

## 2023-04-07 DIAGNOSIS — K219 Gastro-esophageal reflux disease without esophagitis: Secondary | ICD-10-CM

## 2023-07-07 DIAGNOSIS — R972 Elevated prostate specific antigen [PSA]: Secondary | ICD-10-CM | POA: Diagnosis not present

## 2023-08-14 ENCOUNTER — Ambulatory Visit: Payer: PPO | Admitting: Adult Health

## 2023-08-17 ENCOUNTER — Ambulatory Visit (INDEPENDENT_AMBULATORY_CARE_PROVIDER_SITE_OTHER): Payer: PPO | Admitting: Orthopedic Surgery

## 2023-08-17 ENCOUNTER — Encounter: Payer: Self-pay | Admitting: Orthopedic Surgery

## 2023-08-17 VITALS — BP 142/72 | HR 85 | Temp 97.7°F | Resp 16 | Ht 69.0 in | Wt 200.6 lb

## 2023-08-17 DIAGNOSIS — R1011 Right upper quadrant pain: Secondary | ICD-10-CM

## 2023-08-17 DIAGNOSIS — Z23 Encounter for immunization: Secondary | ICD-10-CM | POA: Diagnosis not present

## 2023-08-17 DIAGNOSIS — M25561 Pain in right knee: Secondary | ICD-10-CM | POA: Diagnosis not present

## 2023-08-17 DIAGNOSIS — I700 Atherosclerosis of aorta: Secondary | ICD-10-CM | POA: Diagnosis not present

## 2023-08-17 DIAGNOSIS — R911 Solitary pulmonary nodule: Secondary | ICD-10-CM | POA: Diagnosis not present

## 2023-08-17 DIAGNOSIS — M25562 Pain in left knee: Secondary | ICD-10-CM | POA: Diagnosis not present

## 2023-08-17 DIAGNOSIS — G8929 Other chronic pain: Secondary | ICD-10-CM

## 2023-08-17 DIAGNOSIS — I10 Essential (primary) hypertension: Secondary | ICD-10-CM | POA: Diagnosis not present

## 2023-08-17 DIAGNOSIS — I728 Aneurysm of other specified arteries: Secondary | ICD-10-CM

## 2023-08-17 MED ORDER — PREDNISONE 20 MG PO TABS
ORAL_TABLET | ORAL | 0 refills | Status: AC
Start: 2023-08-17 — End: 2023-08-25
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
## 2023-08-17 NOTE — Patient Instructions (Addendum)
Please come fasting to next visit  Please take prednisone in the morning  Referral made to [facility] due to ongoing right sided pain

## 2023-08-18 ENCOUNTER — Other Ambulatory Visit: Payer: Self-pay | Admitting: Orthopedic Surgery

## 2023-08-18 ENCOUNTER — Encounter: Payer: Self-pay | Admitting: Pediatrics

## 2023-08-18 DIAGNOSIS — R748 Abnormal levels of other serum enzymes: Secondary | ICD-10-CM

## 2023-08-18 LAB — CBC WITH DIFFERENTIAL/PLATELET
Absolute Lymphocytes: 1321 {cells}/uL (ref 850–3900)
Absolute Monocytes: 352 {cells}/uL (ref 200–950)
Basophils Absolute: 31 {cells}/uL (ref 0–200)
Basophils Relative: 0.6 %
Eosinophils Absolute: 92 {cells}/uL (ref 15–500)
Eosinophils Relative: 1.8 %
HCT: 39 % (ref 38.5–50.0)
Hemoglobin: 12.2 g/dL — ABNORMAL LOW (ref 13.2–17.1)
MCH: 20.1 pg — ABNORMAL LOW (ref 27.0–33.0)
MCHC: 31.3 g/dL — ABNORMAL LOW (ref 32.0–36.0)
MCV: 64.1 fL — ABNORMAL LOW (ref 80.0–100.0)
Monocytes Relative: 6.9 %
Neutro Abs: 3305 {cells}/uL (ref 1500–7800)
Neutrophils Relative %: 64.8 %
Platelets: 164 10*3/uL (ref 140–400)
RBC: 6.08 10*6/uL — ABNORMAL HIGH (ref 4.20–5.80)
RDW: 18.3 % — ABNORMAL HIGH (ref 11.0–15.0)
Total Lymphocyte: 25.9 %
WBC: 5.1 10*3/uL (ref 3.8–10.8)

## 2023-08-18 LAB — COMPLETE METABOLIC PANEL WITH GFR
AG Ratio: 1.3 (calc) (ref 1.0–2.5)
ALT: 47 U/L — ABNORMAL HIGH (ref 9–46)
AST: 24 U/L (ref 10–35)
Albumin: 4.4 g/dL (ref 3.6–5.1)
Alkaline phosphatase (APISO): 92 U/L (ref 35–144)
BUN: 19 mg/dL (ref 7–25)
CO2: 29 mmol/L (ref 20–32)
Calcium: 9.4 mg/dL (ref 8.6–10.3)
Chloride: 104 mmol/L (ref 98–110)
Creat: 1.03 mg/dL (ref 0.70–1.28)
Globulin: 3.4 g/dL (ref 1.9–3.7)
Glucose, Bld: 87 mg/dL (ref 65–99)
Potassium: 4.2 mmol/L (ref 3.5–5.3)
Sodium: 139 mmol/L (ref 135–146)
Total Bilirubin: 0.6 mg/dL (ref 0.2–1.2)
Total Protein: 7.8 g/dL (ref 6.1–8.1)
eGFR: 78 mL/min/{1.73_m2} (ref >=60)

## 2023-08-18 LAB — CBC MORPHOLOGY

## 2023-08-28 ENCOUNTER — Other Ambulatory Visit: Payer: PPO

## 2023-08-28 ENCOUNTER — Ambulatory Visit (HOSPITAL_COMMUNITY)
Admission: EM | Admit: 2023-08-28 | Discharge: 2023-08-28 | Disposition: A | Discharge status: Home / Self Care | Payer: PPO | Attending: Family Medicine | Admitting: Family Medicine

## 2023-08-28 ENCOUNTER — Encounter (HOSPITAL_COMMUNITY): Payer: Self-pay

## 2023-08-28 DIAGNOSIS — T148XXA Other injury of unspecified body region, initial encounter: Secondary | ICD-10-CM

## 2023-08-28 MED ORDER — KETOROLAC TROMETHAMINE 30 MG/ML IJ SOLN
30.0000 mg | Freq: Once | INTRAMUSCULAR | Status: AC
  Administered 2023-08-28: 30 mg via INTRAMUSCULAR

## 2023-08-28 MED ORDER — MELOXICAM 7.5 MG PO TABS: 15.0000 mg | ORAL_TABLET | Freq: Every day | ORAL | 0 refills | Status: DC

## 2023-08-28 MED ORDER — KETOROLAC TROMETHAMINE 30 MG/ML IJ SOLN
INTRAMUSCULAR | Status: AC
  Filled 2023-08-28: qty 1

## 2023-08-28 MED ORDER — CYCLOBENZAPRINE HCL 10 MG PO TABS: 10.0000 mg | ORAL_TABLET | Freq: Two times a day (BID) | ORAL | 0 refills | Status: DC | PRN

## 2023-08-28 NOTE — Discharge Instructions (Addendum)
Please take your meloxicam daily starting TOMORROW  Please take your flexeril as needed 2-3 times a day

## 2023-08-28 NOTE — ED Notes (Signed)
Interpreter back on telephone line.
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
## 2023-08-28 NOTE — ED Notes (Signed)
This RN was in the middle of triage when interpreter reported he had to go and would give me a call back.

## 2023-08-28 NOTE — ED Triage Notes (Addendum)
Telephone translator used in pt's triage. Pt presents with lower back pain x 3 days. Pt states he has been taking Tylenol with no improvement in pain. Pt currently rates his back pain a 10/10. Pt denies recent injury/heavy lifting.

## 2023-09-07 ENCOUNTER — Other Ambulatory Visit: Payer: PPO

## 2023-09-07 DIAGNOSIS — R748 Abnormal levels of other serum enzymes: Secondary | ICD-10-CM | POA: Diagnosis not present

## 2023-09-08 ENCOUNTER — Other Ambulatory Visit: Payer: Self-pay | Admitting: Orthopedic Surgery

## 2023-09-08 ENCOUNTER — Other Ambulatory Visit: Payer: PPO

## 2023-09-08 DIAGNOSIS — I700 Atherosclerosis of aorta: Secondary | ICD-10-CM

## 2023-09-08 DIAGNOSIS — T148XXA Other injury of unspecified body region, initial encounter: Secondary | ICD-10-CM

## 2023-09-08 DIAGNOSIS — R748 Abnormal levels of other serum enzymes: Secondary | ICD-10-CM

## 2023-09-08 LAB — HEPATIC FUNCTION PANEL
AG Ratio: 1.2 (calc) (ref 1.0–2.5)
ALT: 43 U/L (ref 9–46)
AST: 30 U/L (ref 10–35)
Albumin: 4.1 g/dL (ref 3.6–5.1)
Alkaline phosphatase (APISO): 85 U/L (ref 35–144)
Bilirubin, Direct: 0.1 mg/dL (ref 0.0–0.2)
Globulin: 3.4 g/dL (ref 1.9–3.7)
Indirect Bilirubin: 0.6 mg/dL (ref 0.2–1.2)
Total Bilirubin: 0.7 mg/dL (ref 0.2–1.2)
Total Protein: 7.5 g/dL (ref 6.1–8.1)

## 2023-09-08 LAB — LIPID PANEL
Cholesterol: 164 mg/dL (ref <200)
HDL: 42 mg/dL (ref >=40)
LDL Cholesterol (Calc): 91 mg/dL
Non-HDL Cholesterol (Calc): 122 mg/dL (ref <130)
Total CHOL/HDL Ratio: 3.9 (calc) (ref <5.0)
Triglycerides: 222 mg/dL — ABNORMAL HIGH (ref <150)

## 2023-09-08 MED ORDER — PREDNISONE 20 MG PO TABS
ORAL_TABLET | ORAL | 0 refills | Status: AC
Start: 2023-09-08 — End: 2023-09-18

## 2023-09-08 NOTE — Progress Notes (Signed)
 Patient calls to report ongoing muscle strain/ low back pain. He has completed meloxicam and flexeril prescribed by ED. Will try prednisone taper. Advised to schedule follow up appointment if pain does not improve after starting prednisone.

## 2023-09-14 ENCOUNTER — Ambulatory Visit (INDEPENDENT_AMBULATORY_CARE_PROVIDER_SITE_OTHER): Payer: PPO | Admitting: Orthopedic Surgery

## 2023-09-14 ENCOUNTER — Encounter: Payer: Self-pay | Admitting: Orthopedic Surgery

## 2023-09-14 VITALS — BP 148/70 | HR 88 | Temp 97.4°F | Resp 16 | Ht 69.0 in | Wt 203.4 lb

## 2023-09-14 DIAGNOSIS — M5441 Lumbago with sciatica, right side: Secondary | ICD-10-CM | POA: Diagnosis not present

## 2023-09-14 MED ORDER — GABAPENTIN 100 MG PO CAPS
100.0000 mg | ORAL_CAPSULE | Freq: Every day | ORAL | 3 refills | Status: DC
Start: 2023-09-14 — End: 2023-12-28
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
## 2023-09-14 NOTE — Patient Instructions (Addendum)
 Will try gabapentin for back and leg pain  Stop medication if you feel too drowsy or foggy  AVOID HEAVY LIFTING, BENDING, LIFTING, TWISTING MOVEMENTS  RECOMMEND LIGHT WALKING

## 2023-10-01 ENCOUNTER — Other Ambulatory Visit: Payer: Self-pay | Admitting: Orthopedic Surgery

## 2023-10-01 DIAGNOSIS — I10 Essential (primary) hypertension: Secondary | ICD-10-CM

## 2023-10-10 ENCOUNTER — Other Ambulatory Visit: Payer: Self-pay | Admitting: *Deleted

## 2023-10-10 DIAGNOSIS — I728 Aneurysm of other specified arteries: Secondary | ICD-10-CM

## 2023-10-23 ENCOUNTER — Ambulatory Visit (INDEPENDENT_AMBULATORY_CARE_PROVIDER_SITE_OTHER): Payer: PPO | Admitting: Adult Health

## 2023-10-23 ENCOUNTER — Encounter: Payer: Self-pay | Admitting: Adult Health

## 2023-10-23 VITALS — BP 142/88 | HR 102 | Temp 98.4°F | Resp 20 | Ht 69.0 in | Wt 196.6 lb

## 2023-10-23 DIAGNOSIS — R059 Cough, unspecified: Secondary | ICD-10-CM | POA: Diagnosis not present

## 2023-10-23 DIAGNOSIS — J069 Acute upper respiratory infection, unspecified: Secondary | ICD-10-CM | POA: Diagnosis not present

## 2023-10-23 DIAGNOSIS — G8929 Other chronic pain: Secondary | ICD-10-CM | POA: Diagnosis not present

## 2023-10-23 DIAGNOSIS — I10 Essential (primary) hypertension: Secondary | ICD-10-CM

## 2023-10-23 DIAGNOSIS — M5441 Lumbago with sciatica, right side: Secondary | ICD-10-CM | POA: Diagnosis not present

## 2023-10-23 DIAGNOSIS — K219 Gastro-esophageal reflux disease without esophagitis: Secondary | ICD-10-CM

## 2023-10-23 DIAGNOSIS — N400 Enlarged prostate without lower urinary tract symptoms: Secondary | ICD-10-CM

## 2023-10-23 LAB — POC COVID19 BINAXNOW: SARS Coronavirus 2 Ag: NEGATIVE

## 2023-10-23 LAB — POCT INFLUENZA A/B
Influenza A, POC: NEGATIVE
Influenza B, POC: NEGATIVE

## 2023-10-23 MED ORDER — BENZONATATE 100 MG PO CAPS
100.0000 mg | ORAL_CAPSULE | Freq: Three times a day (TID) | ORAL | 0 refills | Status: DC | PRN
Start: 2023-10-23 — End: 2023-11-15

## 2023-10-23 MED ORDER — AZITHROMYCIN 250 MG PO TABS
ORAL_TABLET | ORAL | 0 refills | Status: AC
Start: 2023-10-23 — End: 2023-10-28
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
## 2023-10-25 ENCOUNTER — Encounter: Payer: Self-pay | Admitting: Vascular Surgery

## 2023-10-25 ENCOUNTER — Ambulatory Visit (HOSPITAL_COMMUNITY)
Admission: RE | Admit: 2023-10-25 | Discharge: 2023-10-25 | Disposition: A | Discharge status: Home / Self Care | Payer: PPO | Source: Ambulatory Visit | Attending: Vascular Surgery | Admitting: Vascular Surgery

## 2023-10-25 ENCOUNTER — Ambulatory Visit: Payer: PPO | Admitting: Vascular Surgery

## 2023-10-25 VITALS — BP 147/80 | HR 82 | Temp 98.1°F | Ht 69.0 in | Wt 192.0 lb

## 2023-10-25 DIAGNOSIS — I728 Aneurysm of other specified arteries: Secondary | ICD-10-CM | POA: Insufficient documentation
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
## 2023-11-01 ENCOUNTER — Other Ambulatory Visit: Payer: Self-pay

## 2023-11-01 ENCOUNTER — Encounter: Payer: Self-pay | Admitting: Pediatrics

## 2023-11-01 ENCOUNTER — Other Ambulatory Visit: Payer: PPO

## 2023-11-01 ENCOUNTER — Ambulatory Visit: Payer: PPO | Admitting: Pediatrics

## 2023-11-01 VITALS — BP 128/70 | HR 76 | Ht 67.5 in | Wt 197.0 lb

## 2023-11-01 DIAGNOSIS — K760 Fatty (change of) liver, not elsewhere classified: Secondary | ICD-10-CM

## 2023-11-01 DIAGNOSIS — R1011 Right upper quadrant pain: Secondary | ICD-10-CM | POA: Diagnosis not present

## 2023-11-01 DIAGNOSIS — A048 Other specified bacterial intestinal infections: Secondary | ICD-10-CM

## 2023-11-01 DIAGNOSIS — Z8619 Personal history of other infectious and parasitic diseases: Secondary | ICD-10-CM | POA: Diagnosis not present

## 2023-11-01 DIAGNOSIS — R972 Elevated prostate specific antigen [PSA]: Secondary | ICD-10-CM

## 2023-11-01 MED ORDER — FINASTERIDE 5 MG PO TABS
5.0000 mg | ORAL_TABLET | Freq: Every day | ORAL | 1 refills | Status: DC
Start: 2023-11-01 — End: 2024-05-22

## 2023-11-01 NOTE — Patient Instructions (Addendum)
 Your provider has requested that you go to the basement level for lab work before leaving today. Press "B" on the elevator. The lab is located at the first door on the left as you exit the elevator.   _______________________________________________________  If your blood pressure at your visit was 140/90 or greater, please contact your primary care physician to follow up on this.  _______________________________________________________  If you are age 72 or older, your body mass index should be between 23-30. Your Body mass index is 30.4 kg/m. If this is out of the aforementioned range listed, please consider follow up with your Primary Care Provider.  If you are age 37 or younger, your body mass index should be between 19-25. Your Body mass index is 30.4 kg/m. If this is out of the aformentioned range listed, please consider follow up with your Primary Care Provider.   ________________________________________________________  The [facility] providers would like to encourage you to use [facility] to communicate with providers for non-urgent requests or questions.  Due to long hold times on the telephone, sending your provider a message by [facility] may be a faster and more efficient way to get a response.  Please allow 48 business hours for a response.  Please remember that this is for non-urgent requests.  _______________________________________________________    It was a pleasure to see you today!  Thank you for trusting me with your gastrointestinal care!    Maren Beach, MD

## 2023-11-03 LAB — H. PYLORI ANTIGEN, STOOL: H pylori Ag, Stl: POSITIVE — AB

## 2023-11-06 ENCOUNTER — Other Ambulatory Visit: Payer: Self-pay | Admitting: *Deleted

## 2023-11-06 MED ORDER — TALICIA 250-12.5-10 MG PO CPDR: 4.0000 | DELAYED_RELEASE_CAPSULE | Freq: Three times a day (TID) | ORAL | 0 refills | Status: DC

## 2023-11-07 ENCOUNTER — Other Ambulatory Visit: Payer: Self-pay | Admitting: Pediatrics

## 2023-11-08 NOTE — Telephone Encounter (Signed)
 Dr. Doy Hutching, the pharmacy is requesting an alternate medication because the Phoebe Perch is not a covered medication.

## 2023-11-10 MED ORDER — OMEPRAZOLE 40 MG PO CPDR: 40.0000 mg | DELAYED_RELEASE_CAPSULE | Freq: Three times a day (TID) | ORAL | 0 refills | Status: DC

## 2023-11-10 MED ORDER — AMOXICILLIN 500 MG PO CAPS: 1000.0000 mg | ORAL_CAPSULE | Freq: Three times a day (TID) | ORAL | 0 refills | Status: AC

## 2023-11-10 MED ORDER — RIFABUTIN 150 MG PO CAPS: 150.0000 mg | ORAL_CAPSULE | Freq: Two times a day (BID) | ORAL | 0 refills | Status: DC

## 2023-11-10 NOTE — Telephone Encounter (Signed)
 I spoke with the pharmacist and she stated that she is sure that 2 of the medications would be approved but they would have to send the third one to know for sure.  She said to send the medications over.  Sending today.

## 2023-11-11 ENCOUNTER — Other Ambulatory Visit: Payer: Self-pay | Admitting: Pediatrics

## 2023-11-13 NOTE — Telephone Encounter (Signed)
 All 3 medications were approved.

## 2023-11-15 ENCOUNTER — Telehealth: Payer: Self-pay | Admitting: Pediatrics

## 2023-11-15 ENCOUNTER — Ambulatory Visit (INDEPENDENT_AMBULATORY_CARE_PROVIDER_SITE_OTHER): Admitting: Sports Medicine

## 2023-11-15 ENCOUNTER — Ambulatory Visit: Payer: Self-pay | Admitting: Orthopedic Surgery

## 2023-11-15 ENCOUNTER — Encounter: Payer: Self-pay | Admitting: Sports Medicine

## 2023-11-15 VITALS — BP 112/60 | HR 86 | Temp 98.5°F | Resp 16 | Ht 67.5 in | Wt 199.2 lb

## 2023-11-15 DIAGNOSIS — I951 Orthostatic hypotension: Secondary | ICD-10-CM | POA: Diagnosis not present

## 2023-11-15 DIAGNOSIS — D649 Anemia, unspecified: Secondary | ICD-10-CM | POA: Diagnosis not present

## 2023-11-15 DIAGNOSIS — R002 Palpitations: Secondary | ICD-10-CM

## 2023-11-15 DIAGNOSIS — R42 Dizziness and giddiness: Secondary | ICD-10-CM | POA: Diagnosis not present

## 2023-11-15 MED ORDER — LISINOPRIL 10 MG PO TABS: 10.0000 mg | ORAL_TABLET | Freq: Every day | ORAL | 3 refills | Status: DC

## 2023-11-15 NOTE — Telephone Encounter (Signed)
 Spoke with patient and scheduled appointment for today at 1:40 with Dr.Veludandi

## 2023-11-15 NOTE — Telephone Encounter (Signed)
 Inbound call from patient requesting to speak with nurse. States he has questions about instructions for omeprazole and rifabutin medications. Please advise, thank you.

## 2023-11-15 NOTE — Telephone Encounter (Signed)
 Summary  Red Word: Dizziness.  Communication  Patient called in to schedule an appointment. Patient advised of dizziness and neck pain. Transferred to RN Triage.   Chief Complaint: Dizziness, neck pain. Refused Nurse, learning disability. No availability today, offered appointment tomorrow, pt. Hung up. Symptoms: Dizzy Frequency: Last night Pertinent Negatives: Patient denies  Disposition: [] ED /[] Urgent Care (no appt availability in office) / [] Appointment(In office/virtual)/ []  [place] [facility]/ [] Home Care/ [] Refused Recommended Disposition /[] [place] [facility]/ [x]  Follow-up with PCP Additional Notes: Pt. Hung up.  Reason for Disposition  [1] MODERATE dizziness (e.g., interferes with normal activities) AND [2] has NOT been evaluated by doctor (or NP/PA) for this  (Exception: Dizziness caused by heat exposure, sudden standing, or poor fluid intake.)  Answer Assessment - Initial Assessment Questions 1. DESCRIPTION: "Describe your dizziness."     Yes 2. LIGHTHEADED: "Do you feel lightheaded?" (e.g., somewhat faint, woozy, weak upon standing)     Yes 3. VERTIGO: "Do you feel like either you or the room is spinning or tilting?" (i.e. vertigo)     No 4. SEVERITY: "How bad is it?"  "Do you feel like you are going to faint?" "Can you stand and walk?"   - MILD: Feels slightly dizzy, but walking normally.   - MODERATE: Feels unsteady when walking, but not falling; interferes with normal activities (e.g., school, work).   - SEVERE: Unable to walk without falling, or requires assistance to walk without falling; feels like passing out now.      Mild 5. ONSET:  "When did the dizziness begin?"     Last night 6. AGGRAVATING FACTORS: "Does anything make it worse?" (e.g., standing, change in head position)     No 7. HEART RATE: "Can you tell me your heart rate?" "How many beats in 15 seconds?"  (Note: not all patients can do this)       No 8. CAUSE: "What do you think is causing the dizziness?"      Unsure 9. RECURRENT SYMPTOM: "Have you had dizziness before?" If Yes, ask: "When was the last time?" "What happened that time?"     Unsure 10. OTHER SYMPTOMS: "Do you have any other symptoms?" (e.g., fever, chest pain, vomiting, diarrhea, bleeding)       Neck pain 11. PREGNANCY: "Is there any chance you are pregnant?" "When was your last menstrual period?"       N/a  Protocols used: Dizziness - Lightheadedness-A-AH
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
## 2023-11-16 ENCOUNTER — Other Ambulatory Visit: Payer: Self-pay

## 2023-11-16 ENCOUNTER — Ambulatory Visit
Admission: RE | Admit: 2023-11-16 | Discharge: 2023-11-16 | Disposition: A | Discharge status: Home / Self Care | Source: Ambulatory Visit | Attending: Sports Medicine | Admitting: Sports Medicine

## 2023-11-16 ENCOUNTER — Other Ambulatory Visit: Payer: Self-pay | Admitting: Sports Medicine

## 2023-11-16 DIAGNOSIS — D649 Anemia, unspecified: Secondary | ICD-10-CM

## 2023-11-16 DIAGNOSIS — R55 Syncope and collapse: Secondary | ICD-10-CM | POA: Diagnosis not present

## 2023-11-16 DIAGNOSIS — R42 Dizziness and giddiness: Secondary | ICD-10-CM | POA: Diagnosis not present

## 2023-11-16 NOTE — Telephone Encounter (Signed)
 With the interpreter, I spoke to Bobby [name].  He complained about the amount of medication that he has to take.  The interpreter advised him for me that the treatment is for the H-pylori infection.  The original medication that was prescribed was not covered by his insurance so this is a breakdown of that medication.  He asked if he could only take 1 capsule and I advised him that the recommended dose is for the treatment.  Patient finally agreed with the plan of care and said he will take the medication as advised.  I told him that I will update Dr. Doy Hutching.

## 2023-11-16 NOTE — Telephone Encounter (Signed)
 Completed in another chart message.

## 2023-11-17 ENCOUNTER — Other Ambulatory Visit: Payer: Self-pay

## 2023-11-18 LAB — IRON,TIBC AND FERRITIN PANEL
%SAT: 32 % (ref 20–48)
Ferritin: 430 ng/mL — ABNORMAL HIGH (ref 24–380)
Iron: 92 ug/dL (ref 50–180)
TIBC: 287 ug/dL (ref 250–425)

## 2023-11-18 LAB — CBC
HCT: 37.1 % — ABNORMAL LOW (ref 38.5–50.0)
Hemoglobin: 11.7 g/dL — ABNORMAL LOW (ref 13.2–17.1)
MCH: 20.3 pg — ABNORMAL LOW (ref 27.0–33.0)
MCHC: 31.5 g/dL — ABNORMAL LOW (ref 32.0–36.0)
MCV: 64.3 fL — ABNORMAL LOW (ref 80.0–100.0)
Platelets: 152 10*3/uL (ref 140–400)
RBC: 5.77 10*6/uL (ref 4.20–5.80)
RDW: 18.8 % — ABNORMAL HIGH (ref 11.0–15.0)
WBC: 5.2 10*3/uL (ref 3.8–10.8)

## 2023-11-18 LAB — BASIC METABOLIC PANEL WITH GFR
BUN: 16 mg/dL (ref 7–25)
CO2: 28 mmol/L (ref 20–32)
Calcium: 9.2 mg/dL (ref 8.6–10.3)
Chloride: 102 mmol/L (ref 98–110)
Creat: 1.24 mg/dL (ref 0.70–1.28)
Glucose, Bld: 89 mg/dL (ref 65–139)
Potassium: 4.4 mmol/L (ref 3.5–5.3)
Sodium: 136 mmol/L (ref 135–146)
eGFR: 62 mL/min/{1.73_m2} (ref >=60)

## 2023-11-18 LAB — TEST AUTHORIZATION

## 2023-12-11 ENCOUNTER — Other Ambulatory Visit

## 2023-12-28 ENCOUNTER — Ambulatory Visit (INDEPENDENT_AMBULATORY_CARE_PROVIDER_SITE_OTHER): Payer: PPO | Admitting: Orthopedic Surgery

## 2023-12-28 ENCOUNTER — Encounter: Payer: Self-pay | Admitting: Orthopedic Surgery

## 2023-12-28 VITALS — BP 126/74 | HR 70 | Temp 97.1°F | Ht 67.5 in | Wt 199.8 lb

## 2023-12-28 DIAGNOSIS — Z122 Encounter for screening for malignant neoplasm of respiratory organs: Secondary | ICD-10-CM

## 2023-12-28 DIAGNOSIS — I728 Aneurysm of other specified arteries: Secondary | ICD-10-CM

## 2023-12-28 DIAGNOSIS — D649 Anemia, unspecified: Secondary | ICD-10-CM | POA: Diagnosis not present

## 2023-12-28 DIAGNOSIS — A048 Other specified bacterial intestinal infections: Secondary | ICD-10-CM | POA: Diagnosis not present

## 2023-12-28 DIAGNOSIS — R42 Dizziness and giddiness: Secondary | ICD-10-CM

## 2023-12-28 MED ORDER — PANTOPRAZOLE SODIUM 40 MG PO TBEC: 40.0000 mg | DELAYED_RELEASE_TABLET | Freq: Every day | ORAL | 1 refills | Status: DC

## 2023-12-28 MED ORDER — IRON (FERROUS SULFATE) 325 (65 FE) MG PO TABS: 325.0000 mg | ORAL_TABLET | ORAL | 3 refills | Status: DC

## 2023-12-28 NOTE — Addendum Note (Signed)
 Addended byUlyses Gandy E on: 12/28/2023 04:14 PM   Modules accepted: Orders

## 2023-12-28 NOTE — Patient Instructions (Addendum)
 Stop omeprazole   Start pantoprazole  for stomach protection> take 30 minutes before eating   Schedule screening for lung cancer> CT scan of chest at [facility]   Plan to recheck splenic artery summer 2026  Please consider getting Shingles vaccine at local pharmacy   Consider Claritin or Zyrtec for itching skin> take one tablet every night
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
## 2023-12-29 ENCOUNTER — Telehealth: Payer: Self-pay | Admitting: *Deleted

## 2023-12-29 ENCOUNTER — Telehealth: Payer: Self-pay

## 2023-12-29 ENCOUNTER — Other Ambulatory Visit: Payer: Self-pay

## 2023-12-29 DIAGNOSIS — A048 Other specified bacterial intestinal infections: Secondary | ICD-10-CM

## 2023-12-29 NOTE — Telephone Encounter (Signed)
-----   Message from Nurse Dede Fanny sent at 12/29/2023  8:04 AM EDT -----  ----- Message ----- From: Glennette Lanius, RN Sent: 12/29/2023  12:00 AM EDT To: Glennette Lanius, RN  Needs h pylori stool antigen done around 01/01/24; see 11/01/23 h pylori test result; can use vietnamese interpreter; mcgreal pt; place orders and remind pt

## 2023-12-29 NOTE — Telephone Encounter (Signed)
 Copied from CRM [id number]. Topic: General - Other >> Dec 29, 2023 10:04 AM Retta Caster wrote: Reason for CRM: CT CHEST LUNG CA SCREEN LOW DOSE W/O CM (Order [id number] had app 04/24 and patient stated he was to get a xray order but only show for CT. Is this correct. Patient needs call back on clarification if only CT or he need xray order also.  [phone]

## 2023-12-29 NOTE — Telephone Encounter (Signed)
 Last CT chest for lung cancer screening was done 12/26/2022. I ordered another one for this year since he has a h/o smoking and only quit a few years ago. A xray of the chest will not do. I also need him to NOT take ferrous sulfate  ( iron ) like we discussed yesterday. I am worried it will upset his stomach at this time.

## 2023-12-29 NOTE — Telephone Encounter (Signed)
 Tried calling patient. No Answer.

## 2023-12-29 NOTE — Telephone Encounter (Signed)
 Spoke with patient via interpreter services & he's been reminded to come in to submit stool kit, and advised on when/where to go. He verbalized all understanding.

## 2024-01-01 ENCOUNTER — Other Ambulatory Visit

## 2024-01-01 DIAGNOSIS — A048 Other specified bacterial intestinal infections: Secondary | ICD-10-CM

## 2024-01-03 ENCOUNTER — Encounter: Payer: Self-pay | Admitting: Pediatrics

## 2024-01-03 LAB — H. PYLORI ANTIGEN, STOOL: H pylori Ag, Stl: NEGATIVE

## 2024-01-03 NOTE — Telephone Encounter (Signed)
 Tried calling patient. No Answer.

## 2024-01-05 NOTE — Telephone Encounter (Signed)
 Tried calling patient. No Answer.

## 2024-01-09 NOTE — Telephone Encounter (Signed)
 Tried calling patient. No Answer.

## 2024-01-16 ENCOUNTER — Ambulatory Visit: Payer: Self-pay

## 2024-01-16 ENCOUNTER — Other Ambulatory Visit: Payer: Self-pay

## 2024-01-16 ENCOUNTER — Ambulatory Visit
Admission: RE | Admit: 2024-01-16 | Discharge: 2024-01-16 | Disposition: A | Discharge status: Home / Self Care | Source: Ambulatory Visit | Attending: Family Medicine | Admitting: Family Medicine

## 2024-01-16 VITALS — BP 135/72 | HR 64 | Temp 97.8°F | Resp 17

## 2024-01-16 DIAGNOSIS — S39012A Strain of muscle, fascia and tendon of lower back, initial encounter: Secondary | ICD-10-CM

## 2024-01-16 DIAGNOSIS — M5442 Lumbago with sciatica, left side: Secondary | ICD-10-CM

## 2024-01-16 MED ORDER — PREDNISONE 20 MG PO TABS
40.0000 mg | ORAL_TABLET | Freq: Every day | ORAL | 0 refills | Status: AC
Start: 2024-01-16 — End: 2024-01-21

## 2024-01-16 MED ORDER — KETOROLAC TROMETHAMINE 30 MG/ML IJ SOLN
15.0000 mg | Freq: Once | INTRAMUSCULAR | Status: AC
  Administered 2024-01-16: 15 mg via INTRAMUSCULAR

## 2024-01-16 NOTE — Telephone Encounter (Signed)
   Copied from CRM [id number]. Topic: Clinical - Red Word Triage >> Jan 16, 2024  9:09 AM Shelby Dessert H wrote: [facility] that prompted transfer to Nurse Triage: Patient called and his back is on a pain level on 10, patient states he's having a hard time walking due to the pain, patient woke up and had a lot of pain in his lower back, patient took medicine for the pain last night.
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
## 2024-01-16 NOTE — Discharge Instructions (Signed)
 You were given a Toradol  injection in clinic today. Do not take any over the counter NSAID's such as Advil, ibuprofen, Aleve , or naproxen  for 24 hours. You may take tylenol  if needed.  May take prednisone  daily for 5 days.  Do heat to the low back and rest.  Please follow-up with your PCP in 2 to 3 days for recheck.  Please go to the ER for any worsening symptoms.  Hope you feel better soon!

## 2024-01-16 NOTE — Telephone Encounter (Signed)
 Agree with recommendation

## 2024-01-16 NOTE — ED Triage Notes (Signed)
 Pt c/o lower back pain that radiates down both legs started yesterday. Pt states he was pulling a bag and was twisting when he got the back pain. Pt states the bag was not heavy. Pt denies loss of bowel or bladder.

## 2024-01-16 NOTE — Telephone Encounter (Signed)
 Copied from CRM [id number]. Topic: Clinical - Red Word Triage >> Jan 16, 2024  8:55 AM Tisa Forester wrote: Red Word that prompted transfer to Nurse Triage: severe back pain rate pain 10  daughter Tiana Flurry is on the designated part release and is speaking on patient behalf   Patient call back number [phone]  Chief Complaint: Back pain Symptoms: Difficulty walking Frequency: Since yesterday Pertinent Negatives: Patient denies weakness Disposition: [] ED /[x] Urgent Care (no appt availability in office) / [] Appointment(In office/virtual)/ []  [facility]/ [] Home Care/ [] Refused Recommended Disposition /[] [facility]/ []  Follow-up with PCP Additional Notes: This RN was able to connect with patient via a [place] interpreter on the line. Patient stated he is experiencing severe back pain. Patient stated pain is located in the middle right side of his back. Patient stated pain started after he pulled something down at work yesterday. Patient stated the pain makes it hard to walk. Patient denied relief from pain medications. Advised patient to be seen within 4 hours, per protocol. No availability at PCP office today. Advised UC, due to severity of pain. Assisted patient with making an appointment at a nearby UC for patient. Reviewed location and time of appointment with patient. Patient verbalized understanding.  Reason for Disposition  [1] SEVERE back pain (e.g., excruciating, unable to do any normal activities) AND [2] not improved 2 hours after pain medicine  Answer Assessment - Initial Assessment Questions 1. ONSET: "When did the pain begin?"      Yesterday  2. LOCATION: "Where does it hurt?" (upper, mid or lower back)     Right middle of back  3. SEVERITY: "How bad is the pain?"  (e.g., Scale 1-10; mild, moderate, or severe)   - MILD (1-3): Doesn't interfere with normal activities.    - MODERATE (4-7): Interferes with normal activities or awakens from sleep.    -  SEVERE (8-10): Excruciating pain, unable to do any normal activities.      Rates pain a 10, unable to describe pain, but states he feels a burning sensation when walking 4. PATTERN: "Is the pain constant?" (e.g., yes, no; constant, intermittent)      Constant  5. RADIATION: "Does the pain shoot into your legs or somewhere else?"     Radiates around his back  6. CAUSE:  "What do you think is causing the back pain?"      States he pulled something down at work and felt a pop/sensation 7. BACK OVERUSE:  "Any recent lifting of heavy objects, strenuous work or exercise?"     See question above 8. MEDICINES: "What have you taken so far for the pain?" (e.g., nothing, acetaminophen , NSAIDS)     Tylenol - not helping at all, warm compresses do not helping either 9. NEUROLOGIC SYMPTOMS: "Do you have any weakness, numbness, or problems with bowel/bladder control?"     Denies weakness, denies numbness 10. OTHER SYMPTOMS: "Do you have any other symptoms?" (e.g., fever, abdomen pain, burning with urination, blood in urine)     States it is very painful to walk  Protocols used: Back Pain-A-AH

## 2024-01-16 NOTE — Telephone Encounter (Signed)
Message routed to PCP Fargo, Amy E, NP  

## 2024-01-18 ENCOUNTER — Ambulatory Visit
Admission: RE | Admit: 2024-01-18 | Discharge: 2024-01-18 | Disposition: A | Discharge status: Home / Self Care | Source: Ambulatory Visit | Attending: Orthopedic Surgery | Admitting: Orthopedic Surgery

## 2024-01-18 ENCOUNTER — Ambulatory Visit (INDEPENDENT_AMBULATORY_CARE_PROVIDER_SITE_OTHER): Admitting: Orthopedic Surgery

## 2024-01-18 ENCOUNTER — Encounter: Payer: Self-pay | Admitting: Orthopedic Surgery

## 2024-01-18 VITALS — BP 148/92 | HR 88 | Temp 97.7°F | Resp 11 | Ht 67.5 in | Wt 204.2 lb

## 2024-01-18 DIAGNOSIS — R911 Solitary pulmonary nodule: Secondary | ICD-10-CM | POA: Diagnosis not present

## 2024-01-18 DIAGNOSIS — Z122 Encounter for screening for malignant neoplasm of respiratory organs: Secondary | ICD-10-CM | POA: Diagnosis not present

## 2024-01-18 DIAGNOSIS — I10 Essential (primary) hypertension: Secondary | ICD-10-CM | POA: Diagnosis not present

## 2024-01-18 DIAGNOSIS — Z0000 Encounter for general adult medical examination without abnormal findings: Secondary | ICD-10-CM

## 2024-01-18 DIAGNOSIS — Z87891 Personal history of nicotine dependence: Secondary | ICD-10-CM | POA: Diagnosis not present

## 2024-01-18 MED ORDER — LISINOPRIL-HYDROCHLOROTHIAZIDE 10-12.5 MG PO TABS: 1.0000 | ORAL_TABLET | Freq: Every day | ORAL | 3 refills | Status: DC

## 2024-01-18 NOTE — Patient Instructions (Signed)
  Bobby [name] , Thank you for taking time to come for your Medicare Wellness Visit. I appreciate your ongoing commitment to your health goals. Please review the following plan we discussed and let me know if I can assist you in the future.   These are the goals we discussed:  Goals      Maintain Mobility and Function     Evidence-based guidance:  Acknowledge and validate impact of pain, loss of strength and potential disfigurement (hand osteoarthritis) on mental health and daily life, such as social isolation, anxiety, depression, impaired sexual relationship and   injury from falls.  Anticipate referral to physical or occupational therapy for assessment, therapeutic exercise and recommendation for adaptive equipment or assistive devices; encourage participation.  Assess impact on ability to perform activities of daily living, as well as engage in sports and leisure events or requirements of work or school.  Provide anticipatory guidance and reassurance about the benefit of exercise to maintain function; acknowledge and normalize fear that exercise may worsen symptoms.  Encourage regular exercise, at least 10 minutes at a time for 45 minutes per week; consider yoga, water exercise and proprioceptive exercises; encourage use of wearable activity tracker to increase motivation and adherence.  Encourage maintenance or resumption of daily activities, including employment, as pain allows and with minimal exposure to trauma.  Assist patient to advocate for adaptations to the work environment.  Consider level of pain and function, gender, age, lifestyle, patient preference, quality of life, readiness and ?ocapacity to benefit? when recommending patients for orthopaedic surgery consultation.  Explore strategies, such as changes to medication regimen or activity that enables patient to anticipate and manage flare-ups that increase deconditioning and disability.  Explore patient preferences; encourage  exposure to a broader range of activities that have been avoided for fear of experiencing pain.  Identify barriers to participation in therapy or exercise, such as pain with activity, anticipated or imagined pain.  Monitor postoperative joint replacement or any preexisting joint replacement for ongoing pain and loss of function; provide social support and encouragement throughout recovery.   Notes:         This is a list of the screening recommended for you and due dates:  Health Maintenance  Topic Date Due   Zoster (Shingles) Vaccine (1 of 2) Never done   COVID-19 Vaccine (2 - 2024-25 season) 02/03/2024*   Flu Shot  04/05/2024   Medicare Annual Wellness Visit  01/17/2025   DTaP/Tdap/Td vaccine (2 - Tdap) 04/08/2027   Colon Cancer Screening  03/19/2028   Pneumonia Vaccine  Completed   Hepatitis C Screening  Completed   HPV Vaccine  Aged Out   Meningitis B Vaccine  Aged Out   Cologuard (Stool DNA test)  Discontinued  *Topic was postponed. The date shown is not the original due date.
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
## 2024-01-19 NOTE — Telephone Encounter (Signed)
Reason for Disposition  . Caller has already spoken with another triager and has no further questions.    Protocols used: No Contact or Duplicate Contact Call-A-AH
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
## 2024-01-31 ENCOUNTER — Ambulatory Visit: Payer: PPO | Admitting: Pediatrics

## 2024-01-31 ENCOUNTER — Encounter: Payer: Self-pay | Admitting: Pediatrics

## 2024-01-31 VITALS — BP 130/68 | HR 64 | Ht 70.0 in | Wt 197.0 lb

## 2024-01-31 DIAGNOSIS — A048 Other specified bacterial intestinal infections: Secondary | ICD-10-CM

## 2024-01-31 DIAGNOSIS — Z8619 Personal history of other infectious and parasitic diseases: Secondary | ICD-10-CM

## 2024-01-31 DIAGNOSIS — R1011 Right upper quadrant pain: Secondary | ICD-10-CM | POA: Diagnosis not present

## 2024-01-31 DIAGNOSIS — R109 Unspecified abdominal pain: Secondary | ICD-10-CM

## 2024-01-31 DIAGNOSIS — Z9049 Acquired absence of other specified parts of digestive tract: Secondary | ICD-10-CM

## 2024-01-31 DIAGNOSIS — Z8719 Personal history of other diseases of the digestive system: Secondary | ICD-10-CM | POA: Diagnosis not present

## 2024-01-31 DIAGNOSIS — K760 Fatty (change of) liver, not elsewhere classified: Secondary | ICD-10-CM | POA: Diagnosis not present

## 2024-01-31 MED ORDER — DICYCLOMINE HCL 20 MG PO TABS: 20.0000 mg | ORAL_TABLET | Freq: Three times a day (TID) | ORAL | 3 refills | Status: AC

## 2024-01-31 NOTE — Patient Instructions (Signed)
 We have sent the following medications to your pharmacy for you to pick up at your convenience: Dicyclomine 20mg , take 1 tablet three times a day as needed for abdominal pain.  You have been scheduled for a CT scan of the abdomen and pelvis at [facility], 1st floor Radiology. You are scheduled on 02/05/24 at 1:30 pm. You should arrive 30 minutes prior to your appointment time for registration.    You may take any medications as prescribed with a small amount of water, if necessary. If you take any of the following medications: METFORMIN, GLUCOPHAGE, GLUCOVANCE, AVANDAMET, RIOMET, FORTAMET, ACTOPLUS MET, JANUMET, GLUMETZA or METAGLIP, you MAY be asked to HOLD this medication 48 hours AFTER the exam.   The purpose of you drinking the oral contrast is to aid in the visualization of your intestinal tract. The contrast solution may cause some diarrhea. Depending on your individual set of symptoms, you may also receive an intravenous injection of x-ray contrast/dye. Plan on being at [facility] for 45 minutes or longer, depending on the type of exam you are having performed.   If you have any questions regarding your exam or if you need to reschedule, you may call [facility] at [phone] between the hours of 8:00 am and 5:00 pm, Monday-Friday.   Thank you for entrusting me with your care and for choosing [facility],  Dr. Eugenia Hess   _______________________________________________________  If your blood pressure at your visit was 140/90 or greater, please contact your primary care physician to follow up on this.  _______________________________________________________  If you are age 72 or older, your body mass index should be between 23-30. Your Body mass index is 28.27 kg/m. If this is out of the aforementioned range listed, please consider follow up with your Primary Care Provider.  If you are age 57 or younger, your body mass index should be between 19-25.  Your Body mass index is 28.27 kg/m. If this is out of the aformentioned range listed, please consider follow up with your Primary Care Provider.   ________________________________________________________  The [facility] providers would like to encourage you to use MYCHART to communicate with providers for non-urgent requests or questions.  Due to long hold times on the telephone, sending your provider a message by [facility] may be a faster and more efficient way to get a response.  Please allow 48 business hours for a response.  Please remember that this is for non-urgent requests.  _______________________________________________________

## 2024-02-05 ENCOUNTER — Ambulatory Visit (HOSPITAL_COMMUNITY)
Admission: RE | Admit: 2024-02-05 | Discharge: 2024-02-05 | Disposition: A | Discharge status: Home / Self Care | Source: Ambulatory Visit | Attending: Pediatrics | Admitting: Pediatrics

## 2024-02-05 ENCOUNTER — Ambulatory Visit: Payer: Self-pay | Admitting: Pediatrics

## 2024-02-05 DIAGNOSIS — N400 Enlarged prostate without lower urinary tract symptoms: Secondary | ICD-10-CM | POA: Diagnosis not present

## 2024-02-05 DIAGNOSIS — R109 Unspecified abdominal pain: Secondary | ICD-10-CM | POA: Diagnosis not present

## 2024-02-05 DIAGNOSIS — K56609 Unspecified intestinal obstruction, unspecified as to partial versus complete obstruction: Secondary | ICD-10-CM | POA: Diagnosis not present

## 2024-02-05 DIAGNOSIS — Z8719 Personal history of other diseases of the digestive system: Secondary | ICD-10-CM | POA: Insufficient documentation

## 2024-02-05 DIAGNOSIS — N281 Cyst of kidney, acquired: Secondary | ICD-10-CM | POA: Diagnosis not present

## 2024-02-05 DIAGNOSIS — Z9049 Acquired absence of other specified parts of digestive tract: Secondary | ICD-10-CM | POA: Diagnosis not present

## 2024-02-05 LAB — POCT I-STAT CREATININE: Creatinine, Ser: 1.2 mg/dL (ref 0.61–1.24)

## 2024-02-05 MED ORDER — IOHEXOL 300 MG/ML  SOLN
100.0000 mL | Freq: Once | INTRAMUSCULAR | Status: AC | PRN
  Administered 2024-02-05: 100 mL via INTRAVENOUS

## 2024-02-05 MED ORDER — SODIUM CHLORIDE (PF) 0.9 % IJ SOLN
INTRAMUSCULAR | Status: AC
  Filled 2024-02-05: qty 50

## 2024-02-09 ENCOUNTER — Ambulatory Visit: Payer: Self-pay | Admitting: Orthopedic Surgery

## 2024-03-19 ENCOUNTER — Other Ambulatory Visit: Payer: Self-pay | Admitting: Orthopedic Surgery

## 2024-03-19 DIAGNOSIS — K219 Gastro-esophageal reflux disease without esophagitis: Secondary | ICD-10-CM

## 2024-03-19 DIAGNOSIS — A048 Other specified bacterial intestinal infections: Secondary | ICD-10-CM

## 2024-03-19 MED ORDER — PANTOPRAZOLE SODIUM 40 MG PO TBEC: 40.0000 mg | DELAYED_RELEASE_TABLET | Freq: Every day | ORAL | 2 refills | Status: AC

## 2024-03-19 NOTE — Telephone Encounter (Signed)
 Patient called and notified. He states that he will go to pharmacy and pick up new prescription.

## 2024-03-19 NOTE — Telephone Encounter (Signed)
 Patient has request refill on medication Omeprazole . Patient medication has allergy contraindications. Medication pend and sent to PCP [name], NP for approval.

## 2024-03-22 ENCOUNTER — Other Ambulatory Visit: Payer: Self-pay | Admitting: Pediatrics

## 2024-03-22 ENCOUNTER — Telehealth: Payer: Self-pay | Admitting: Orthopedic Surgery

## 2024-03-22 NOTE — Telephone Encounter (Signed)
 Copied from CRM [id number]. Topic: Clinical - Medication Question >> Mar 22, 2024  2:36 PM Cherylann RAMAN wrote: Reason for CRM: Patient is requesting a prescription refill for omeprazole  (PRILOSEC) 20 MG capsule, however, medication was discontinued by provider. Please contact patient at [phone]

## 2024-03-22 NOTE — Telephone Encounter (Signed)
 Spoke with patient and he is aware that he is on Protonix  not Prilosec. Patient states he does not believe the pharmacy has rx for protonix  because they did not notify him it was there.   I called [facility] and left a detailed voicemail informing them to check records for RX- Protonix , refill, and notify patient

## 2024-04-04 ENCOUNTER — Encounter: Payer: Self-pay | Admitting: Adult Health

## 2024-04-04 ENCOUNTER — Ambulatory Visit (SKILLED_NURSING_FACILITY): Admitting: Adult Health

## 2024-04-04 VITALS — BP 128/60 | HR 73 | Temp 97.8°F | Ht 70.0 in | Wt 196.6 lb

## 2024-04-04 DIAGNOSIS — H8110 Benign paroxysmal vertigo, unspecified ear: Secondary | ICD-10-CM | POA: Diagnosis not present

## 2024-04-04 MED ORDER — MECLIZINE HCL 25 MG PO TABS: 25.0000 mg | ORAL_TABLET | Freq: Three times a day (TID) | ORAL | 0 refills | Status: DC | PRN

## 2024-04-04 NOTE — Patient Instructions (Signed)
 Take meclizine  25 mg three times daily as needed If not better in one week return to clinic
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
## 2024-05-22 ENCOUNTER — Other Ambulatory Visit: Payer: Self-pay | Admitting: Orthopedic Surgery

## 2024-05-22 DIAGNOSIS — R972 Elevated prostate specific antigen [PSA]: Secondary | ICD-10-CM

## 2024-05-28 ENCOUNTER — Encounter: Payer: Self-pay | Admitting: Pediatrics

## 2024-07-04 ENCOUNTER — Encounter: Payer: Self-pay | Admitting: Orthopedic Surgery

## 2024-07-04 ENCOUNTER — Ambulatory Visit (INDEPENDENT_AMBULATORY_CARE_PROVIDER_SITE_OTHER): Admitting: Orthopedic Surgery

## 2024-07-04 VITALS — BP 122/78 | HR 65 | Temp 98.9°F | Ht 70.0 in | Wt 202.6 lb

## 2024-07-04 DIAGNOSIS — G8929 Other chronic pain: Secondary | ICD-10-CM

## 2024-07-04 DIAGNOSIS — L290 Pruritus ani: Secondary | ICD-10-CM

## 2024-07-04 DIAGNOSIS — M5441 Lumbago with sciatica, right side: Secondary | ICD-10-CM | POA: Diagnosis not present

## 2024-07-04 DIAGNOSIS — Z23 Encounter for immunization: Secondary | ICD-10-CM | POA: Diagnosis not present

## 2024-07-04 DIAGNOSIS — I10 Essential (primary) hypertension: Secondary | ICD-10-CM

## 2024-07-04 DIAGNOSIS — R911 Solitary pulmonary nodule: Secondary | ICD-10-CM | POA: Diagnosis not present

## 2024-07-04 MED ORDER — HYDROCORTISONE 1 % EX OINT: 1.0000 | TOPICAL_OINTMENT | Freq: Two times a day (BID) | CUTANEOUS | 0 refills | Status: AC
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
## 2024-07-04 NOTE — Patient Instructions (Signed)
 Please plan for fasting lab work next visit   Continue tylenol  as needed for pain  Apply ointment to itching twice daily

## 2024-07-05 ENCOUNTER — Ambulatory Visit: Payer: Self-pay | Admitting: Orthopedic Surgery

## 2024-07-05 LAB — COMPREHENSIVE METABOLIC PANEL WITH GFR
AG Ratio: 1.4 (calc) (ref 1.0–2.5)
ALT: 37 U/L (ref 9–46)
AST: 25 U/L (ref 10–35)
Albumin: 4.3 g/dL (ref 3.6–5.1)
Alkaline phosphatase (APISO): 81 U/L (ref 35–144)
BUN: 16 mg/dL (ref 7–25)
CO2: 28 mmol/L (ref 20–32)
Calcium: 9.3 mg/dL (ref 8.6–10.3)
Chloride: 103 mmol/L (ref 98–110)
Creat: 1.12 mg/dL (ref 0.70–1.28)
Globulin: 3 g/dL (ref 1.9–3.7)
Glucose, Bld: 95 mg/dL (ref 65–99)
Potassium: 4.6 mmol/L (ref 3.5–5.3)
Sodium: 139 mmol/L (ref 135–146)
Total Bilirubin: 0.7 mg/dL (ref 0.2–1.2)
Total Protein: 7.3 g/dL (ref 6.1–8.1)
eGFR: 70 mL/min/1.73m2 (ref >=60)

## 2024-07-05 LAB — CBC WITH DIFFERENTIAL/PLATELET
Absolute Lymphocytes: 1455 {cells}/uL (ref 850–3900)
Absolute Monocytes: 490 {cells}/uL (ref 200–950)
Basophils Absolute: 29 {cells}/uL (ref 0–200)
Basophils Relative: 0.6 %
Eosinophils Absolute: 279 {cells}/uL (ref 15–500)
Eosinophils Relative: 5.7 %
HCT: 37.4 % — ABNORMAL LOW (ref 38.5–50.0)
Hemoglobin: 11.5 g/dL — ABNORMAL LOW (ref 13.2–17.1)
MCH: 20.4 pg — ABNORMAL LOW (ref 27.0–33.0)
MCHC: 30.7 g/dL — ABNORMAL LOW (ref 32.0–36.0)
MCV: 66.3 fL — ABNORMAL LOW (ref 80.0–100.0)
Monocytes Relative: 10 %
Neutro Abs: 2646 {cells}/uL (ref 1500–7800)
Neutrophils Relative %: 54 %
Platelets: 157 Thousand/uL (ref 140–400)
RBC: 5.64 Million/uL (ref 4.20–5.80)
RDW: 19.2 % — ABNORMAL HIGH (ref 11.0–15.0)
Total Lymphocyte: 29.7 %
WBC: 4.9 Thousand/uL (ref 3.8–10.8)

## 2024-07-05 LAB — CBC MORPHOLOGY

## 2024-07-12 ENCOUNTER — Ambulatory Visit: Payer: Self-pay

## 2024-07-12 ENCOUNTER — Ambulatory Visit (HOSPITAL_COMMUNITY)

## 2024-07-12 ENCOUNTER — Encounter (HOSPITAL_COMMUNITY): Payer: Self-pay

## 2024-07-12 ENCOUNTER — Ambulatory Visit (HOSPITAL_COMMUNITY)
Admission: EM | Admit: 2024-07-12 | Discharge: 2024-07-12 | Disposition: A | Discharge status: Home / Self Care | Attending: Emergency Medicine | Admitting: Emergency Medicine

## 2024-07-12 DIAGNOSIS — J069 Acute upper respiratory infection, unspecified: Secondary | ICD-10-CM | POA: Diagnosis not present

## 2024-07-12 DIAGNOSIS — R059 Cough, unspecified: Secondary | ICD-10-CM | POA: Diagnosis not present

## 2024-07-12 DIAGNOSIS — R051 Acute cough: Secondary | ICD-10-CM

## 2024-07-12 LAB — POC COVID19/FLU A&B COMBO
Covid Antigen, POC: NEGATIVE
Influenza A Antigen, POC: NEGATIVE
Influenza B Antigen, POC: NEGATIVE

## 2024-07-12 MED ORDER — GUAIFENESIN-DM 100-10 MG/5ML PO SYRP
5.0000 mL | ORAL_SOLUTION | ORAL | 0 refills | Status: AC | PRN
Start: 2024-07-12 — End: ?

## 2024-07-12 MED ORDER — GUAIFENESIN-DM 100-10 MG/5ML PO SYRP
5.0000 mL | ORAL_SOLUTION | ORAL | 0 refills | Status: DC | PRN
Start: 2024-07-12 — End: 2024-07-12

## 2024-07-12 NOTE — ED Triage Notes (Signed)
 Pt c/o cough and sore throat x3 days. Took tylenol  and cough meds with no relief.

## 2024-07-12 NOTE — Telephone Encounter (Signed)
 FYI Only or Action Required?: FYI only for provider: patient requesting to be seen today. Recommended to Urgent care to be seen today.  Patient was last seen in primary care on 07/04/2024 by [name], NP.  Called Nurse Triage reporting Cough and Sore Throat.  Symptoms began 2 days ago.  Interventions attempted: Rest, hydration, or home remedies.  Symptoms are: unchanged.  Triage Disposition: See Physician Within 24 Hours  Patient/caregiver understands and will follow disposition?:   Message from [place] B sent at 07/12/2024  2:43 PM EST  Summary: [phone], Bobby [name] is wanting an apt today   Reason for Triage: [phone] patient is cough, sore through and was trying to make an appt for today psc closes at 3pm. Patient states not only did he have sore throat he gets hot and cold and hot all over again         Reason for Disposition  SEVERE coughing spells (e.g., whooping sound after coughing, vomiting after coughing)  Answer Assessment - Initial Assessment Questions Called [employer] to get a Bobby [name] interpreter at [phone]. Was given the phone number for Y'hin a [place] interpreter. Call placed to him at [phone]. Called patient to conference with y'hin. Patient endorses cough for two days that is nonproductive.patient endorses sore throat as well. Patient requesting to be seen today. No appointments in the office. Patient is recommended to be seen at urgent care. Verbalized understanding  1. ONSET: When did the cough begin?      Cough started two days ago 2. SEVERITY: How bad is the cough today?      Moderate to severe 3. SPUTUM: Describe the color of your sputum (e.g., none, dry cough; clear, white, yellow, green)     dry 4. HEMOPTYSIS: Are you coughing up any blood? If Yes, ask: How much? (e.g., flecks, streaks, tablespoons, etc.)     no 5. DIFFICULTY BREATHING: Are you having difficulty breathing? If Yes, ask:  How bad is it? (e.g., mild, moderate, severe)      no 6. FEVER: Do you have a fever? If Yes, ask: What is your temperature, how was it measured, and when did it start?     no 7. CARDIAC HISTORY: Do you have any history of heart disease? (e.g., heart attack, congestive heart failure)      no 8. LUNG HISTORY: Do you have any history of lung disease?  (e.g., pulmonary embolus, asthma, emphysema)     no 9. PE RISK FACTORS: Do you have a history of blood clots? (or: recent major surgery, recent prolonged travel, bedridden)     no 10. OTHER SYMPTOMS: Do you have any other symptoms? (e.g., runny nose, wheezing, chest pain)       Sore throat 11. TRAVEL: Have you traveled out of the country in the last month? (e.g., travel history, exposures)       no  Protocols used: Cough - Acute Non-Productive-A-AH
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
## 2024-07-12 NOTE — Discharge Instructions (Addendum)
 You were seen in the urgent care today for concerns of a cough.  You were negative for COVID-19, influenza A/B, and your chest x-ray is negative.  I suspect you likely have a viral infection causing your current respiratory symptoms.  Please take your cough medication as sent to your pharmacy as prescribed.  For any concerns of new or worsening symptoms, return to urgent care or seek evaluation at the emergency department especially if he began to feel short of breath or have chest pain.

## 2024-07-25 ENCOUNTER — Ambulatory Visit (INDEPENDENT_AMBULATORY_CARE_PROVIDER_SITE_OTHER): Admitting: Orthopedic Surgery

## 2024-07-25 ENCOUNTER — Encounter: Payer: Self-pay | Admitting: Orthopedic Surgery

## 2024-07-25 VITALS — BP 124/68 | HR 64 | Temp 97.0°F | Resp 18 | Ht 70.0 in | Wt 200.2 lb

## 2024-07-25 DIAGNOSIS — R051 Acute cough: Secondary | ICD-10-CM

## 2024-07-25 DIAGNOSIS — J029 Acute pharyngitis, unspecified: Secondary | ICD-10-CM

## 2024-07-25 LAB — POCT INFLUENZA A/B
Influenza A, POC: NEGATIVE
Influenza B, POC: NEGATIVE

## 2024-07-25 MED ORDER — PREDNISONE 20 MG PO TABS: 20.0000 mg | ORAL_TABLET | Freq: Every day | ORAL | 0 refills | Status: AC

## 2024-07-25 MED ORDER — AMOXICILLIN-POT CLAVULANATE 875-125 MG PO TABS: 1.0000 | ORAL_TABLET | Freq: Two times a day (BID) | ORAL | 0 refills | Status: AC
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
## 2024-07-25 NOTE — Patient Instructions (Signed)
 Take prednisone  in the morning  Complete all of Augmentin  Continue Lisinopril -hydrochlorothiazide   Warm fluids for sore throat  Hot steamy showers will help nasal and chest congestion

## 2024-10-09 ENCOUNTER — Other Ambulatory Visit: Payer: Self-pay | Admitting: Sports Medicine

## 2024-10-09 ENCOUNTER — Other Ambulatory Visit: Payer: Self-pay | Admitting: Orthopedic Surgery

## 2024-10-09 DIAGNOSIS — I951 Orthostatic hypotension: Secondary | ICD-10-CM

## 2024-10-09 DIAGNOSIS — I10 Essential (primary) hypertension: Secondary | ICD-10-CM

## 2024-10-09 DIAGNOSIS — R42 Dizziness and giddiness: Secondary | ICD-10-CM

## 2024-10-09 MED ORDER — LISINOPRIL-HYDROCHLOROTHIAZIDE 10-12.5 MG PO TABS: 1.0000 | ORAL_TABLET | Freq: Every day | ORAL | 3 refills | Status: AC

## 2025-01-02 ENCOUNTER — Ambulatory Visit: Admitting: Orthopedic Surgery

## 2025-01-23 ENCOUNTER — Ambulatory Visit: Payer: Self-pay | Admitting: Orthopedic Surgery
